# Patient Record
Sex: Female | Born: 1960 | Race: White | Hispanic: No | Marital: Married | Smoking: Never smoker
Health system: Southern US, Community
[De-identification: ages and names within clinical notes are randomized; demographics above are authoritative.]

## PROBLEM LIST (undated history)

## (undated) DIAGNOSIS — F32A Depression, unspecified: Secondary | ICD-10-CM

## (undated) DIAGNOSIS — G4700 Insomnia, unspecified: Secondary | ICD-10-CM

## (undated) DIAGNOSIS — F329 Major depressive disorder, single episode, unspecified: Secondary | ICD-10-CM

## (undated) DIAGNOSIS — G43909 Migraine, unspecified, not intractable, without status migrainosus: Secondary | ICD-10-CM

## (undated) DIAGNOSIS — N3281 Overactive bladder: Secondary | ICD-10-CM

## (undated) DIAGNOSIS — M797 Fibromyalgia: Secondary | ICD-10-CM

## (undated) DIAGNOSIS — I493 Ventricular premature depolarization: Secondary | ICD-10-CM

## (undated) DIAGNOSIS — K219 Gastro-esophageal reflux disease without esophagitis: Secondary | ICD-10-CM

## (undated) HISTORY — DX: Overactive bladder: N32.81

## (undated) HISTORY — DX: Fibromyalgia: M79.7

## (undated) HISTORY — DX: Migraine, unspecified, not intractable, without status migrainosus: G43.909

## (undated) HISTORY — DX: Major depressive disorder, single episode, unspecified: F32.9

## (undated) HISTORY — DX: Depression, unspecified: F32.A

## (undated) HISTORY — PX: MOLE REMOVAL: SHX2046

## (undated) HISTORY — DX: Insomnia, unspecified: G47.00

---

## 1996-03-14 HISTORY — PX: OTHER SURGICAL HISTORY: SHX169

## 1998-01-14 ENCOUNTER — Other Ambulatory Visit: Admission: RE | Admit: 1998-01-14 | Discharge: 1998-01-14 | Payer: Self-pay | Admitting: Gynecology

## 1998-02-12 ENCOUNTER — Other Ambulatory Visit: Admission: RE | Admit: 1998-02-12 | Discharge: 1998-02-12 | Payer: Self-pay | Admitting: Gynecology

## 1999-01-18 ENCOUNTER — Other Ambulatory Visit: Admission: RE | Admit: 1999-01-18 | Discharge: 1999-01-18 | Payer: Self-pay | Admitting: Gynecology

## 2000-03-21 ENCOUNTER — Other Ambulatory Visit: Admission: RE | Admit: 2000-03-21 | Discharge: 2000-03-21 | Payer: Self-pay | Admitting: Gynecology

## 2001-03-30 ENCOUNTER — Other Ambulatory Visit: Admission: RE | Admit: 2001-03-30 | Discharge: 2001-03-30 | Payer: Self-pay | Admitting: Gynecology

## 2002-01-01 ENCOUNTER — Encounter: Admission: RE | Admit: 2002-01-01 | Discharge: 2002-01-01 | Payer: Self-pay | Admitting: Gynecology

## 2002-01-01 ENCOUNTER — Encounter: Payer: Self-pay | Admitting: Gynecology

## 2002-04-15 ENCOUNTER — Other Ambulatory Visit: Admission: RE | Admit: 2002-04-15 | Discharge: 2002-04-15 | Payer: Self-pay | Admitting: Gynecology

## 2003-03-15 HISTORY — PX: OTHER SURGICAL HISTORY: SHX169

## 2003-04-17 ENCOUNTER — Other Ambulatory Visit: Admission: RE | Admit: 2003-04-17 | Discharge: 2003-04-17 | Payer: Self-pay | Admitting: Gynecology

## 2003-07-10 ENCOUNTER — Encounter: Admission: RE | Admit: 2003-07-10 | Discharge: 2003-07-10 | Payer: Self-pay | Admitting: Gynecology

## 2004-05-03 ENCOUNTER — Other Ambulatory Visit: Admission: RE | Admit: 2004-05-03 | Discharge: 2004-05-03 | Payer: Self-pay | Admitting: Gynecology

## 2004-07-20 ENCOUNTER — Encounter: Admission: RE | Admit: 2004-07-20 | Discharge: 2004-07-20 | Payer: Self-pay | Admitting: Gynecology

## 2005-05-13 ENCOUNTER — Other Ambulatory Visit: Admission: RE | Admit: 2005-05-13 | Discharge: 2005-05-13 | Payer: Self-pay | Admitting: Gynecology

## 2005-06-09 ENCOUNTER — Inpatient Hospital Stay (HOSPITAL_COMMUNITY): Admission: EM | Admit: 2005-06-09 | Discharge: 2005-06-11 | Payer: Self-pay | Admitting: Gynecology

## 2005-06-09 ENCOUNTER — Encounter (INDEPENDENT_AMBULATORY_CARE_PROVIDER_SITE_OTHER): Payer: Self-pay | Admitting: *Deleted

## 2005-06-09 ENCOUNTER — Encounter: Payer: Self-pay | Admitting: Gynecology

## 2005-06-28 ENCOUNTER — Encounter: Payer: Self-pay | Admitting: Vascular Surgery

## 2005-06-28 ENCOUNTER — Ambulatory Visit (HOSPITAL_COMMUNITY): Admission: RE | Admit: 2005-06-28 | Discharge: 2005-06-28 | Payer: Self-pay | Admitting: Gynecology

## 2005-07-21 ENCOUNTER — Encounter: Admission: RE | Admit: 2005-07-21 | Discharge: 2005-07-21 | Payer: Self-pay | Admitting: Gynecology

## 2005-10-11 ENCOUNTER — Other Ambulatory Visit: Admission: RE | Admit: 2005-10-11 | Discharge: 2005-10-11 | Payer: Self-pay | Admitting: Gynecology

## 2006-06-20 ENCOUNTER — Other Ambulatory Visit: Admission: RE | Admit: 2006-06-20 | Discharge: 2006-06-20 | Payer: Self-pay | Admitting: Gynecology

## 2006-07-24 ENCOUNTER — Encounter: Admission: RE | Admit: 2006-07-24 | Discharge: 2006-07-24 | Payer: Self-pay | Admitting: Gynecology

## 2007-06-25 ENCOUNTER — Other Ambulatory Visit: Admission: RE | Admit: 2007-06-25 | Discharge: 2007-06-25 | Payer: Self-pay | Admitting: Gynecology

## 2007-08-14 ENCOUNTER — Encounter: Admission: RE | Admit: 2007-08-14 | Discharge: 2007-08-14 | Payer: Self-pay | Admitting: Gynecology

## 2008-01-21 ENCOUNTER — Ambulatory Visit: Payer: Self-pay | Admitting: Gynecology

## 2008-07-14 ENCOUNTER — Encounter: Payer: Self-pay | Admitting: Gynecology

## 2008-07-14 ENCOUNTER — Other Ambulatory Visit: Admission: RE | Admit: 2008-07-14 | Discharge: 2008-07-14 | Payer: Self-pay | Admitting: Gynecology

## 2008-07-14 ENCOUNTER — Ambulatory Visit: Payer: Self-pay | Admitting: Gynecology

## 2008-09-09 ENCOUNTER — Encounter: Admission: RE | Admit: 2008-09-09 | Discharge: 2008-09-09 | Payer: Self-pay | Admitting: Gynecology

## 2009-07-20 ENCOUNTER — Other Ambulatory Visit: Admission: RE | Admit: 2009-07-20 | Discharge: 2009-07-20 | Payer: Self-pay | Admitting: Gynecology

## 2009-07-20 ENCOUNTER — Ambulatory Visit: Payer: Self-pay | Admitting: Gynecology

## 2009-08-21 ENCOUNTER — Ambulatory Visit: Payer: Self-pay | Admitting: Gynecology

## 2009-09-08 ENCOUNTER — Ambulatory Visit: Payer: Self-pay | Admitting: Gynecology

## 2009-11-24 ENCOUNTER — Encounter: Admission: RE | Admit: 2009-11-24 | Discharge: 2009-11-24 | Payer: Self-pay | Admitting: Gynecology

## 2009-11-24 ENCOUNTER — Ambulatory Visit: Payer: Self-pay | Admitting: Gynecology

## 2009-11-30 ENCOUNTER — Ambulatory Visit (HOSPITAL_BASED_OUTPATIENT_CLINIC_OR_DEPARTMENT_OTHER): Admission: RE | Admit: 2009-11-30 | Discharge: 2009-11-30 | Payer: Self-pay | Admitting: Gynecology

## 2009-11-30 ENCOUNTER — Ambulatory Visit: Payer: Self-pay | Admitting: Gynecology

## 2009-11-30 HISTORY — PX: OTHER SURGICAL HISTORY: SHX169

## 2009-12-07 ENCOUNTER — Ambulatory Visit: Payer: Self-pay | Admitting: Gynecology

## 2009-12-09 ENCOUNTER — Ambulatory Visit: Payer: Self-pay | Admitting: Gynecology

## 2010-01-04 ENCOUNTER — Ambulatory Visit: Payer: Self-pay | Admitting: Gynecology

## 2010-07-30 NOTE — Discharge Summary (Signed)
[name], [name]              ACCOUNT NO.:  [id number]   MEDICAL RECORD NO.:  [id number]          PATIENT TYPE:  INP   LOCATION:  1621                         FACILITY:  [facility]   PHYSICIAN:  Juan H. Lily Peer, M.D.DATE OF BIRTH:  07-28-1960   DATE OF ADMISSION:  06/09/2005  DATE OF DISCHARGE:  06/11/2005                                 DISCHARGE SUMMARY   Total days hospitalized:  2.   HISTORY:  The patient is a 50 year old with chronic pelvic pain and left  ovarian mass, had undergone an attempted laparoscopy for her chronic pelvic  pain and left ovarian mass.  Subsequently, she experienced a left epigastric  artery trocar injury requiring exploratory laparotomy for ligation of the  left epigastric artery and subsequently a left ovarian cystectomy.  An  intraoperative consultation with Dr. Darnell Level had been utilized and the  small and large bowel had been inspected thoroughly and no other injuries  were identified and it was due to bleeding epigastric vessel that had been  contained and secured at the time of the emergency laparotomy.  The patient  postoperatively did well.  After 24 hours she had adequate urine output, her  vital signs were stable.  Her hemoglobin was 10.8.  Her Foley catheter was  discontinued as well as her PCA was discontinued.  Her diet was advanced  from clear liquid to regular diet and she was ready to be discharged home on  her second day and was instructed to follow up in the office within 72 hours  to have her staples removed.  Final pathology report demonstrated immature  teratoma.   FINAL DISPOSITION AND FOLLOWUP:  The patient was discharged home on her  second postoperative day.  She was instructed to follow up in the office  within 72 hours to have her staples removed.  She was given a prescription  of Tylox to take one p.o. q.4-6h. p.r.n. pain.  Discharge instructions were  provided and she will follow up accordingly.      Juan H.  Lily Peer, M.D.  Electronically Signed     JHF/MEDQ  D:  07/27/2005  T:  07/27/2005  Job:  [id number]

## 2010-07-30 NOTE — Op Note (Signed)
Terri, Romero              ACCOUNT NO.:  [id number]   MEDICAL RECORD NO.:  [id number]          PATIENT TYPE:  INP   LOCATION:  1621                         FACILITY:  [facility]   PHYSICIAN:  Juan H. Lily Peer, M.D.DATE OF BIRTH:  May 04, 1960   DATE OF PROCEDURE:  06/09/2005  DATE OF DISCHARGE:                                 OPERATIVE REPORT   SURGEON:  Juan H. Lily Peer, MD   ASSISTANT:  Edyth Gunnels, MD   INDICATIONS FOR OPERATION:  A 50 year old with chronic pelvic pain and left  ovarian mass.   PREOPERATIVE DIAGNOSES:  1.  Chronic pelvic pain.  2.  Left ovarian mass.   POSTOPERATIVE DIAGNOSES:  1.  Chronic pelvic pain.  2.  Left ovarian mass.  3.  Left ovarian cyst.   ANESTHESIA:  General endotracheal anesthesia.   PROCEDURES PERFORMED:  1.  Attempted laparoscopy.  2.  Left epigastric artery trocar injury.  3.  Exploratory laparotomy.  4.  Ligation of left epigastric artery.  5.  Left ovarian cystectomy.  6.  Intraoperative consultation with general surgeon, Dr. Darnell Level.   FINDINGS:  1.  Left epigastric arterial injury secondary to trocar.  2.  Left dermoid cyst.  Normal-appearing tubes and normal contralateral      ovary.  No evidence of endometriosis.  No pelvic adhesions, but she did      have a significant amount of omental tissue and scarring from previous      laparoscopic cholecystectomy.   DESCRIPTION OF OPERATION:  After the patient was adequately counseled, she  was taken to the operating room where she underwent a successful general  endotracheal anesthesia.  The patient had PSA stockings for DVT prophylaxis  as she received 1 g of cefoxitin for prophylaxis as well.  The patient's  preoperative hemoglobin was 14.5.  Once general endotracheal anesthesia was  obtained, her abdomen was prepped and draped in the usual sterile fashion.  A Foley catheter had been inserted in an effort to monitor urinary output.  A Cohen's cannula had been placed  in an effort to a chromopertubation.  The  patient had wanted to maintain fertility and wanted to have both tubes and  ovaries left in place if at all possible and in case she wanted to get  pregnant in the future, although she is currently on oral contraceptive  pill.  Once the abdomen had been prepped and draped in a sterile fashion, a  small stab incision was made underneath the umbilicus using the clear  view/OptiVu trocar.  The peritoneal cavity appeared to have been entered,  although on numerous attempts of insufflation was unsuccessful.  A direct  visualization open laparoscopic technique, whereby the fascia had been  penetrated and the peritoneum.  The fascia was grasped with Allis clamps in  a pursestring fashion.  Then 0 Vicryl suture was utilized and under direct  visualization, the 10 mm trocar was placed, was removed.  The sleeve was  left in place, and the suture around the fascia was secured.  The  pneumoperitoneum was established with 3 liters of carbon dioxide.  The  patient was placed in Trendelenburg position and under laparoscopic  guidance, the first 5 mm port was made with a 5 mm trocar under laparoscopic  guidance in the right lower abdomen without any complications.  The second  trocar was made approximately 5 fingerbreadths in the midline in the lower  pelvis after transilluminating the abdomen and steering clear away for any  vessels and under laparoscopic guidance, a 5 mm trocar was inserted.  At  this point, it was noted that there was an arterial pumper with high  suspicion that a left epigastric artery and had been injured with the 5 mm  trocar, and immediately the laparoscopic instruments were removed and  emergency exploratory laparotomy was started in the following fashion.  A  Pfannenstiel skin incision was made 2 cm above the symphysis pubis.  The  incision was carried down from subcutaneous tissue down to the rectus  fascia.  The subcutaneous tissue was  freed, and identification of left  epigastric artery was evident where the bleeding was coming from and after  muscle-splitting technique of the rectus muscle to identify the epigastric  vessel.  They were individually secured with 3-0 Vicryl suture.  The  peritoneal cavity was entered.  Blood and clots were removed.  The pelvic  cavity was copiously irrigated with normal solution, and the general surgery  consultation with Dr. Darnell Level.  In a systematic fashion, the entire  pelvic cavity was inspected.  It appears that she has a small hematoma near  the trocar site, and the small bowel and mesentery were inspected.  No  evidence of injury, nor was there any injury to the sigmoid colon, the cul-  de-sac, or any of the major vessels in the pelvis under inspection.  Attention was then placed to the uterus and tubes and ovaries.  Both tubes  appeared to be normal with lush fimbriated end.  The right ovary was normal  on inspection.  The left ovary had a 3 x 3 cm cyst which was suspicious for  a dermoid.  Anterior and posterior cul-de-sac free of adhesions and  endometriosis.  An elliptical incision was made on the cortex of the left  ovary, and the capsule was peeled off allowing the cyst to be removed in  total, intact, and was passed off the operative field for histological  evaluation.  The left ovarian cortex was closed in a mattress-like suture  fashion with 3-0 Vicryl suture in a layered fashion for hemostasis and  reapproximation and reconstruction of the left ovary.  The pelvic cavity was  once again copiously irrigated with normal saline solution.  Thorough  inspection did not demonstrate any further bleeding, and closure was  started.  The visceral peritoneum was not reapproximated.  The rectus fascia  was closed with a running stitch of 0 Vicryl suture.  The subcutaneous bleeders were Bovie cauterized.  The skin was reapproximated with skin clips  followed by placement of  Xeroform gauze and 4 x 4 dressing.  The  subumbilical fascia was closed with a previous pursestring suture of 0  Vicryl suture, and the skin was reapproximated with a 4-0 plain catgut  suture in a subcuticular fashion.  Both 5 mm ports were closed with a single  stapler and for postoperative analgesia, approximately 20 mL of 0.25%  Marcaine was infiltrated in all 3 port sites.  Of note, a Cohen's cannula  had been removed during the exploratory laparotomy.  Inspection at the  completion of the operation  inspected the area where the tenaculum was  holding the anterior cervical lip.  There was some mild oozing which was  contained with silver nitrate.  The patient was then awakened and  transferred to recovery room with stable vital signs.  Her preoperative  hemoglobin was 14.5.  Intraoperative hemoglobin was 10.6.  She received 3200  mL of lactated Ringer's.  Her urine output was 200 mL and clear.      Juan H. Lily Peer, M.D.  Electronically Signed     JHF/MEDQ  D:  06/09/2005  T:  06/11/2005  Job:  [id number]

## 2010-07-30 NOTE — H&P (Signed)
Romero, [name]              ACCOUNT NO.:  [id number]   MEDICAL RECORD NO.:  [id number]           PATIENT TYPE:   LOCATION:                                 FACILITY:   PHYSICIAN:  Juan H. Lily Peer, M.D.     DATE OF BIRTH:   DATE OF ADMISSION:  DATE OF DISCHARGE:                                HISTORY & PHYSICAL   CHIEF COMPLAINT:  1.  Chronic pelvic pain.  2.  Left ovarian mass.   HISTORY OF PRESENT ILLNESS:  The patient is a 50 year old, gravida 0, who  was seen in the office for a preoperative consultation on June 02, 2005.  The patient stated that she had been complaining of low back pain and  abdominal discomfort for 11 months.  Sometimes she stated her pains radiate  to the right lower abdomen.  She also suffers from dyspareunia.  She states  she cannot relate the pain to a particular time in her cycle.  She said it  occurs all the time.  She had been evaluated previously by her primary  physician, Dr. Gwendlyn Deutscher, and he ordered an x-ray and MRI which all were  negative.  She subsequently was referred to Dr. Chaney Malling, an orthopedist,  who did a bone scan and no abnormalities were noted.  There was no evidence  of herniated disk, but an ultrasound of the pelvis had not been done.  The  patient continued to have lower abdominal pain.  She had been tender on  examination but no rebound/guarding.  On pelvic exam, she was tender mostly  on the right adnexa and a discernible mass per se could not be palpated.  Interestingly, an ultrasound was done which demonstrated normal right ovary  but her left ovary had adnexal echogenic mass measuring 3.9 x 3.6 x 3.8 cm,  possibly a dermoid cyst, but with her symptoms, this possibly could be an  endometrioma.  The patient is wishing to maintain her fertility, although  she is 50 years of age, but has agreed to proceed with a laparoscopic left  salpingo-oophorectomy.   PAST MEDICAL HISTORY:  1.  She denies any allergies.  2.  She  has history of cervical dysplasia.  3.  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  4.  Fibromyalgia.  5.  Depression.  6.  Overactive bladder, although she is no longer on Detrol.  7.  Migraine headaches.   PAST SURGICAL HISTORY:  1.  Pilonidal cyst in 1998.  2.  LEEP cervical conization.  3.  She had a precancerous mole removed on her back in 2004.  4.  Laparoscopic cholecystectomy in 2005.   MEDICATIONS:  1.  The patient is on at least 28-day oral contraceptive pill.  2.  Calcium supplementation.  3.  Inderal 120 mg every other day for migraine headaches.  4.  Imitrex p.r.n.   FAMILY HISTORY:  Cardiovascular disease in her father.  Paternal grandmother  with breast cancer.   PHYSICAL EXAMINATION:  VITAL SIGNS:  Weight 183 pounds, height 5  feet 6  inches tall, blood pressure 122/84.  HEENT:  Unremarkable.  NECK:  Supple.  Trachea midline.  No carotid bruits.  No thyromegaly.  LUNGS:  Clear to auscultation without any rhonchi, rubs, or wheezes.  HEART:  Regular rate and rhythm without murmurs or gallops.  BREASTS:  Done at the time of her annual exam in March of this year which  was normal.  ABDOMEN:  Soft, minimal tenderness in the right lower quadrant but no  rebound or guarding.  PELVIC:  Bartholin's, urethral, and Skene's within normal limits.  Vagina  and cervix with no lesion or discharge.  Uterus anteverted, normal size,  shape, and consistency.  Adnexa without any masses or tenderness.  RECTAL:  Exam unremarkable.   ASSESSMENT:  A 50 year old, gravida 0, with chronic lower abdominal and back  pain.  Ultrasound demonstrating that she has a solid echogenic mass  measuring 3.9 x 3.6 x 3.8 cm off the left ovary, possible dermoid cyst.  Right ovary was normal.  Normal size uterus.  The patient is scheduled to  undergo laparoscopic left salpingo-oophorectomy and possible treatment of  underlying endometriosis.  If this is not a  dermoid, the most likely  diagnosis would be endometriosis with her symptoms also of dyspareunia.  The  risks and benefits and pros and cons of the operation were discussed with  the patient to include infection, bleeding, trauma to internal organs, and  in the event of technical difficulty, the laparoscope will need to be  removed, the incision extended, and allow direct entrance into the  peritoneal cavity to finish the operation, also in the event of any trauma  to internal organs such as to the bladder, intestines, or other internal  abdominal structures; the risk of deep venous thrombosis, although she will  have PSA stockings for prophylaxis.  For prophylaxis of infection, she will  have intravenous antibiotic.  All efforts will be made to preserve as much  fertility as possible.  Will also do a chromopertubation to make sure that  her tube is patent on the remaining side.  Also potential risks are  hemorrhage.  In the event that she would need blood or blood products, she  is fully aware of the potential risk of anaphylactic reaction, hepatitis,  and AIDS.  All of these issues were discussed with the patient and all  questions were answered and we will follow accordingly.   PLAN:  The patient is scheduled for laparoscopic left salpingo-oophorectomy  Thursday, March 29, at 1 p.m. in [facility].      Juan H. Lily Peer, M.D.  Electronically Signed     JHF/MEDQ  D:  06/08/2005  T:  06/09/2005  Job:  [id number]

## 2010-09-13 ENCOUNTER — Encounter (INDEPENDENT_AMBULATORY_CARE_PROVIDER_SITE_OTHER): Payer: BC Managed Care – PPO | Admitting: Gynecology

## 2010-09-13 ENCOUNTER — Other Ambulatory Visit (HOSPITAL_COMMUNITY)
Admission: RE | Admit: 2010-09-13 | Discharge: 2010-09-13 | Disposition: A | Discharge status: Home / Self Care | Payer: BC Managed Care – PPO | Source: Ambulatory Visit | Attending: Gynecology | Admitting: Gynecology

## 2010-09-13 ENCOUNTER — Other Ambulatory Visit: Payer: Self-pay | Admitting: Gynecology

## 2010-09-13 DIAGNOSIS — R635 Abnormal weight gain: Secondary | ICD-10-CM

## 2010-09-13 DIAGNOSIS — Z124 Encounter for screening for malignant neoplasm of cervix: Secondary | ICD-10-CM | POA: Insufficient documentation

## 2010-09-13 DIAGNOSIS — R809 Proteinuria, unspecified: Secondary | ICD-10-CM

## 2010-09-13 DIAGNOSIS — Z01419 Encounter for gynecological examination (general) (routine) without abnormal findings: Secondary | ICD-10-CM

## 2010-09-13 DIAGNOSIS — Z13220 Encounter for screening for lipoid disorders: Secondary | ICD-10-CM

## 2010-09-14 ENCOUNTER — Other Ambulatory Visit: Payer: BC Managed Care – PPO

## 2010-09-14 ENCOUNTER — Ambulatory Visit (INDEPENDENT_AMBULATORY_CARE_PROVIDER_SITE_OTHER): Payer: BC Managed Care – PPO | Admitting: Gynecology

## 2010-09-14 DIAGNOSIS — N831 Corpus luteum cyst of ovary, unspecified side: Secondary | ICD-10-CM

## 2010-09-14 DIAGNOSIS — N946 Dysmenorrhea, unspecified: Secondary | ICD-10-CM

## 2010-09-14 DIAGNOSIS — N949 Unspecified condition associated with female genital organs and menstrual cycle: Secondary | ICD-10-CM

## 2010-09-16 ENCOUNTER — Encounter: Payer: Self-pay | Admitting: *Deleted

## 2010-09-21 ENCOUNTER — Other Ambulatory Visit (INDEPENDENT_AMBULATORY_CARE_PROVIDER_SITE_OTHER): Payer: BC Managed Care – PPO

## 2010-09-21 DIAGNOSIS — R799 Abnormal finding of blood chemistry, unspecified: Secondary | ICD-10-CM

## 2010-09-28 ENCOUNTER — Other Ambulatory Visit (INDEPENDENT_AMBULATORY_CARE_PROVIDER_SITE_OTHER): Payer: BC Managed Care – PPO

## 2010-09-28 DIAGNOSIS — D72829 Elevated white blood cell count, unspecified: Secondary | ICD-10-CM

## 2010-10-01 ENCOUNTER — Other Ambulatory Visit: Payer: BC Managed Care – PPO

## 2010-10-01 ENCOUNTER — Institutional Professional Consult (permissible substitution) (INDEPENDENT_AMBULATORY_CARE_PROVIDER_SITE_OTHER): Payer: BC Managed Care – PPO | Admitting: Gynecology

## 2010-10-01 DIAGNOSIS — N83209 Unspecified ovarian cyst, unspecified side: Secondary | ICD-10-CM

## 2010-10-18 ENCOUNTER — Other Ambulatory Visit: Payer: BC Managed Care – PPO

## 2010-11-22 ENCOUNTER — Telehealth: Payer: Self-pay | Admitting: *Deleted

## 2010-11-22 ENCOUNTER — Other Ambulatory Visit: Payer: Self-pay | Admitting: Gynecology

## 2010-11-22 DIAGNOSIS — Z1231 Encounter for screening mammogram for malignant neoplasm of breast: Secondary | ICD-10-CM

## 2010-11-22 NOTE — Telephone Encounter (Signed)
Pt called stating that on 10/01/10 office visit. Pt was told by dr. Glenetta Romero to make appointment of f/u u/s of cyst in September. Pt dictation note on 7/20/212 the pt should come in 3 months from this date. Recall letter in computer per donna to remind pt to make ultrasound appointment.

## 2010-11-26 ENCOUNTER — Ambulatory Visit
Admission: RE | Admit: 2010-11-26 | Discharge: 2010-11-26 | Disposition: A | Discharge status: Home / Self Care | Payer: BC Managed Care – PPO | Source: Ambulatory Visit | Attending: Gynecology | Admitting: Gynecology

## 2010-11-26 DIAGNOSIS — Z1231 Encounter for screening mammogram for malignant neoplasm of breast: Secondary | ICD-10-CM

## 2011-02-28 ENCOUNTER — Telehealth: Payer: Self-pay | Admitting: *Deleted

## 2011-02-28 MED ORDER — LEVONORGESTREL-ETHINYL ESTRAD 0.1-20 MG-MCG PO TABS: 1.0000 | ORAL_TABLET | Freq: Every day | ORAL | Status: DC

## 2011-02-28 NOTE — Telephone Encounter (Signed)
Pt called wanting refill on birth control alessa 28 day tablet. rx sent to phamacy. Last annual in July 2012

## 2011-06-14 ENCOUNTER — Other Ambulatory Visit: Payer: Self-pay | Admitting: Gynecology

## 2011-10-05 ENCOUNTER — Encounter: Payer: Self-pay | Admitting: Gynecology

## 2011-10-05 ENCOUNTER — Ambulatory Visit (INDEPENDENT_AMBULATORY_CARE_PROVIDER_SITE_OTHER): Payer: BC Managed Care – PPO | Admitting: Gynecology

## 2011-10-05 VITALS — BP 128/86 | Ht 65.0 in | Wt 183.0 lb

## 2011-10-05 DIAGNOSIS — R635 Abnormal weight gain: Secondary | ICD-10-CM

## 2011-10-05 DIAGNOSIS — N3946 Mixed incontinence: Secondary | ICD-10-CM | POA: Insufficient documentation

## 2011-10-05 DIAGNOSIS — Z01419 Encounter for gynecological examination (general) (routine) without abnormal findings: Secondary | ICD-10-CM

## 2011-10-05 DIAGNOSIS — R102 Pelvic and perineal pain unspecified side: Secondary | ICD-10-CM

## 2011-10-05 DIAGNOSIS — Z8742 Personal history of other diseases of the female genital tract: Secondary | ICD-10-CM

## 2011-10-05 DIAGNOSIS — N949 Unspecified condition associated with female genital organs and menstrual cycle: Secondary | ICD-10-CM

## 2011-10-05 DIAGNOSIS — R6882 Decreased libido: Secondary | ICD-10-CM

## 2011-10-05 DIAGNOSIS — N393 Stress incontinence (female) (male): Secondary | ICD-10-CM

## 2011-10-05 LAB — CBC WITH DIFFERENTIAL/PLATELET
Basophils Absolute: 0 10*3/uL (ref 0.0–0.1)
Eosinophils Absolute: 0.1 10*3/uL (ref 0.0–0.7)
Eosinophils Relative: 1 % (ref 0–5)
Lymphs Abs: 1.5 10*3/uL (ref 0.7–4.0)
MCH: 31.2 pg (ref 26.0–34.0)
MCHC: 34.4 g/dL (ref 30.0–36.0)
MCV: 90.5 fL (ref 78.0–100.0)
Platelets: 344 10*3/uL (ref 150–400)
RDW: 12.8 % (ref 11.5–15.5)

## 2011-10-05 MED ORDER — LEVONORGESTREL-ETHINYL ESTRAD 0.1-20 MG-MCG PO TABS: 1.0000 | ORAL_TABLET | Freq: Every day | ORAL | Status: DC

## 2011-10-05 NOTE — Patient Instructions (Addendum)
Exercise to Lose Weight Exercise and a healthy diet may help you lose weight. Your doctor may suggest specific exercises. EXERCISE IDEAS AND TIPS  Choose low-cost things you enjoy doing, such as walking, bicycling, or exercising to workout videos.   Take stairs instead of the elevator.   Walk during your lunch break.   Park your car further away from work or school.   Go to a gym or an exercise class.   Start with 5 to 10 minutes of exercise each day. Build up to 30 minutes of exercise 4 to 6 days a week.   Wear shoes with good support and comfortable clothes.   Stretch before and after working out.   Work out until you breathe harder and your heart beats faster.   Drink extra water when you exercise.   Do not do so much that you hurt yourself, feel dizzy, or get very short of breath.  Exercises that burn about 150 calories:  Running 1  miles in 15 minutes.   Playing volleyball for 45 to 60 minutes.   Washing and waxing a car for 45 to 60 minutes.   Playing touch football for 45 minutes.   Walking 1  miles in 35 minutes.   Pushing a stroller 1  miles in 30 minutes.   Playing basketball for 30 minutes.   Raking leaves for 30 minutes.   Bicycling 5 miles in 30 minutes.   Walking 2 miles in 30 minutes.   Dancing for 30 minutes.   Shoveling snow for 15 minutes.   Swimming laps for 20 minutes.   Walking up stairs for 15 minutes.   Bicycling 4 miles in 15 minutes.   Gardening for 30 to 45 minutes.   Jumping rope for 15 minutes.   Washing windows or floors for 45 to 60 minutes.  Document Released: 04/02/2010 Document Revised: 11/10/2010 Document Reviewed: 04/02/2010 [facility] Patient Information 2012 [place], [place].                                           Dietary therapy for weight gain   INTRODUCTION - The optimal management of overweight and obesity requires a combination of diet, exercise, and behavioral modification. In addition, some patients  eventually require pharmacologic therapy or bariatric surgery. The risk of overweight to the subject should be evaluated before beginning any treatment program. Selection of treatment can then be made using a risk-benefit assessment). The choice of therapy is dependent on several factors including the degree of overweight or obesity and patient preference.  This topic will review the dietary therapy of obesity. Other aspects of treatment are discussed separately. (See "Health hazards associated with obesity in adults" and "Overview of therapy for obesity in adults" and "Drug therapy of obesity" and "Behavioral strategies in the treatment of obesity".) GOALS OF WEIGHT LOSS - It is important to set goals when discussing a dietary weight loss program with an individual patient. An initial weight loss goal of 5 to 7 percent of body weight is realistic for most individuals. The first goal for any overweight individual is to prevent further weight gain and keep body weight stable (within 5 pounds of its current level).  The goal of the clinician is to identify and review with the patient a realistic weight-loss goal. Most patients have a weight loss goal of 30 percent or more below current weight, which  is unrealistic [1].  A successful program will lead to a weight loss of more than 5 percent of initial weight [2]. A weight loss of more than 5 percent can reduce risk factors for cardiovascular disease, such as dyslipidemia, hypertension, and diabetes mellitus [3]. In the Diabetes Prevention Program, a multi-center trial in patients with impaired glucose tolerance, weight loss of 7 percent reduced the rate of progression from impaired glucose tolerance to diabetes by 58 percent [4]. (See "Prediction and prevention of type 2 diabetes mellitus", section on 'Diabetes Prevention Program'.)  Loss of 5 percent of initial body weight and maintenance of this loss is a good medical result, even if the subject does not reach  his or her "dream" weight.  Although an extremely difficult goal to achieve, a body mass index (BMI) between 20 and 25 kg/m2 puts the subject in the lowest risk category (table 1 and figure 1). DIETARY ENERGY Rate of weight loss - The rate of weight loss is directly related to the difference between the subject's energy intake and energy requirements. Reducing caloric intake below expenditure results in a predictable initial rate of weight loss that is related to the energy deficit [5,6]. However, prediction of weight loss for an individual subject can be difficult because of marked intersubject variability in initial body composition, adherence, and energy expenditure [5,7]. Food records are often inaccurate. Most normal-weight people under-report what they eat by 10 to 30 percent, while overweight people under-report by 30 percent or more [8]. In addition, energy requirements are influenced by fidgeting, gender, age, and genetic factors [5,6,9]. As examples: Men lose more weight than women of similar height and weight when they comply with eating any given diet because men have more lean body mass, less percent body fat, and therefore higher energy expenditure.  Older subjects of either sex have a lower energy expenditure and therefore lose weight more slowly than younger subjects; metabolic rate declines by approximately 2 percent per decade (about 100 kcal/decade) [10].  The importance of genetic factors is illustrated by a study of identical female twin pairs who were overfed to induce weight gain [11]. Twelve twin pairs were overfed by 1000 kcal/day for 84 of 100 days. The degree of weight gain at a constant dietary caloric increment varied widely among the twin pairs (from 4.3 to 13.3 kg), in fact, there was three times the variance for both weight and fat mass among the twin pairs when compared with that within the twin pairs. Approximately 22 kcal/kg is required to maintain a kilogram of body weight in  a normal adult. Thus, the expected or calculated energy expenditure for a woman weighing 100 kg is approximately 2200 kcal/day. The variability of 20 percent could give energy needs as high as 2620 kcal/day or as low as 1860 kcal/day. An average deficit of 500 kcal/day should result in an initial weight loss of approximately 0.5 kg/week (1 lb/week). However, after three to six months of weight loss, energy expenditure adaptations occur, which slow the bodyweight response to a given change in energy intake, thereby diminishing ongoing weight loss [7]. There are several methods of formally estimating energy expenditure; we suggest using the WHO criteria (table 2). This method allows a direct estimate of resting metabolic rate (RMR) and calculation of daily energy requirement. The low activity level (1.3 x RMR) includes subjects who lead a sedentary life. The high activity level (1.7 x RMR) applies to those in jobs requiring manual labor or patients with regular daily physical exercise   programs [12]. Maintenance of weight loss - It is important for the overweight subject to understand that achieving and maintaining weight loss is made difficult by the reduction in energy expenditure that is induced by weight loss (figure 2) [13]. Weight loss maintenance is also difficult because of changes in the peripheral hormone signals that regulate appetite. Gastrointestinal peptides, such as ghrelin, which stimulates appetite, and gastric inhibitory polypeptide, which may promote energy storage, increase after diet-induced weight loss. Other circulating mediators that inhibit intake (eg, leptin, peptide YY, cholecystokinin, pancreatic polypeptide) decrease. These hormonal adaptations favoring weight gain persist for at least one year after diet-induced weight loss [14]. (See "Overview of therapy for obesity in adults", section on 'Maintenance of weight loss' and "Pathogenesis of obesity", section on 'Ghrelin'.)  TYPES OF  DIETS - The general consensus is that excess intake of calories from any source, associated with a sedentary lifestyle, causes weight gain and obesity. The goal of dietary therapy, therefore, is to decrease energy intake from food. Conventional diets are defined as those below energy requirements but above 800 kcal/day [15]. These diets fall into four groups: Balanced low-calorie diets/portion-controlled diets  Low-fat diets  Low-carbohydrate diets  Mediterranean diet  Fad diets (diets involving unusual combinations of foods or eating sequences) Commercial weight loss programs and internet-based programs are discussed elsewhere. (See "Behavioral strategies in the treatment of obesity".) Balanced low-calorie diets - Planning a diet requires the selection of a caloric intake and then selection of foods to meet this intake. It is desirable to eat foods with adequate nutrients in addition to protein, carbohydrate, and essential fatty acids. Thus, weight-reducing diets should eliminate alcohol, sugar-containing beverages, and most highly concentrated sweets because they rarely contain adequate amounts of other nutrients besides energy. Breakdown of some protein is to be expected during weight loss. When weight increases as a result of overeating, approximately 75 percent of the extra energy is stored as fat and the remaining 25 percent as lean tissue. If the lean tissue contains 20 percent protein, then 5 percent of the extra weight gain would be protein. Thus, it should be anticipated that during weight loss, at least 5 percent of weight loss will be protein. A desirable feature of any calorie restricted diet, however, is that it results in the lowest possible loss of protein, recognizing that this will not be less than 5 percent of the weight that is lost. Portion-controlled diets - One simple approach to providing a calorie-controlled diet is to use individually packaged foods, such as formula diet drinks  using powdered or liquid formula diets, nutrition bars, frozen food, and pre-packaged meals that can be stored at room temperature as the main source of nutrients. Frozen low-calorie meals containing 250 to 350 kcal/package can be a convenient and nutritious way to do this. We have often recommended the use of formula diets or breakfast bars for breakfast, formula diets or a frozen lunch entree for lunch, and a frozen calorie-controlled entree with additional vegetables for dinner. In this way, it is possible to obtain a calorie-controlled 1000 to 1500 kcal per day diet. In one four-year study this approach resulted in early initial weight loss, which then was maintained [16]. I do not recommend the use of formula diets alone because they do not provide adequate nutritional variety. Low-fat diets - Low-fat diets are another standard strategy to help patients lose weight, and almost all dietary guidelines recommend a reduction in the daily intake of fat to 30 percent of energy intake or less [  17,18]. In a meta-analysis of trials comparing low-fat diets (typically 20 to 25 percent of energy from fats) with a control group consuming a usual diet or a medium fat diet (usually 35 to 40 percent of energy), there was greater weight loss (approximately 3 kg) with low-fat compared with moderate fat diets [19]. In addition, one report noted that people who successfully keep their weight reduced adopt three strategies, one of which is eating a lower fat diet [20]. (See "Dietary fat" and "Etiology and natural history of obesity", section on 'Dietary habits'.) A low-fat dietary pattern with healthy carbohydrates is not associated with weight gain. This was illustrated by the [facility] Dietary Modification Trial of [phone] postmenopausal women over age 27 years who were randomly assigned to a dietary intervention that included group and individual sessions to promote a decrease in fat intake and increases in  fruit, vegetable, and grain consumption (healthy carbohydrates), but did not include weight loss or caloric restriction goals, or a control group which received only dietary educational materials [21]. After an average of 7.5 years of follow-up, the following results were seen: Women in the intervention group lost weight in the first year (mean of 2.2 kg) and maintained lower weight than the control women at 7.5 years (difference of 1.9 kg at one year, and 0.4 kg at 7.5 years).  No tendency toward weight gain was seen in the intervention group overall, or when stratified by age, ethnicity, or body mass index.  Weight loss was related to the level of fat intake and was greatest in women who decreased their percentage of energy from fat the most. A similar, but lesser trend was seen with increased vegetable and fruit intake. A low-fat diet can be implemented in two ways. First, the dietitian can provide the subject with specific menu plans that emphasize the use of reduced fat foods. As one guideline, if a food "melts" in your mouth, it probably has fat in it. Second, subjects can be instructed in counting fat grams as an alternative to counting calories. Fat has 9.4 kcal/g. It is thus very easy to calculate the number of grams of fat a subject can eat for any given level of energy intake. Many experts recommend keeping calories from fat to below 30 percent of total calories. In practical terms, this means eating about 33 g of fat for each 1000 calories in the diet. For simplicity, I use 30 g of fat or less for each 1000 kcal. For a 1500-calorie diet, this would mean about 45 g or less of fat, which can be counted using the nutrition information labels on food packages. Low-carbohydrate diets - Proponents of low-carbohydrate diets have argued that the increasing obesity epidemic may be in part due to low-fat, high-carbohydrate diets. But this may be dependent upon the type of carbohydrates that are eaten, such  as energy dense snacks and sugar or high fructose containing beverages. The carbohydrate content of the diet is an important determinant of short-term (less than two weeks) weight loss. Low (60 to 130 grams of carbohydrates) and very low-carbohydrate diets (0 to <60 grams) have been popular for many years [15]. Restriction of carbohydrates leads to glycogen mobilization and, if carbohydrate intake is less than 50 g/day, ketosis will develop. Rapid weight loss occurs, primarily due to glycogen breakdown and fluid loss rather than fat loss. Low and very low-carbohydrate diets are more effective for short-term weight loss than low-fat diets, although probably not for long-term weight loss. A meta-analysis  of five trials found that the difference in weight loss at six months, favoring the low carbohydrate over low fat diet, was not sustained at 12 months [22]. (See 'Comparison trials' below.) Low-carbohydrate diets may have some other beneficial effects with regard to risk of developing type 2 diabetes mellitus, coronary heart disease, and some cancers, particularly if attention is paid to the type as well as the quantity of carbohydrate. A low-carbohydrate diet can be implemented in two ways, either by reducing the total amount of carbohydrate or by consuming foods with a lower glycemic index or glycemic load (table 3). Glycemic index and load are reviewed separately. (See "Dietary carbohydrates", section on 'Glycemic index'.) If a low-carbohydrate diet is chosen, healthy choices for fat (mono- and polyunsaturated fats) and protein (fish, nuts, legumes, and poultry) should be encouraged because of the association between saturated fat intake and risk of coronary heart disease. During 26 years of follow-up of women in the Nurses' Health Study and 20 years of follow-up of men in the Health Professionals' Follow-up Study, low carbohydrate diets in the highest versus lowest decile for vegetable proteins and fat were  associated with lower all-cause mortality (HR 0.80, 95% CI 0.75-0.85) and cardiovascular mortality (HR 0.77, 95% CI 0.68-0.87) [23]. In contrast, low carbohydrate diets in the highest versus lowest decile for animal protein and fat were associated with higher all-cause (HR 1.23, 95% CI 1.11-1.37) and cardiovascular (HR 1.14, 95% CI 1.01-1.29) mortality. (See "Dietary fat" and "Overview of primary prevention of coronary heart disease and stroke", section on 'Healthy diet'.) High protein diets - Some popular books recommend high protein diets [24]. In one trial, low-fat diets with 12 percent and 25 percent protein content were compared. Weight loss over six months was greater with the higher protein diet (9 versus 5 kg), but the difference was no longer significant at 12 and 24 months [25]. Higher protein diets may improve weight maintenance, as illustrated by the results of a study of 60 subjects randomly assigned to a low fat, high protein versus low-fat, high-carbohydrate diet after completing a four week very low calorie diet [26]. Among the subjects who completed the three-month study (n = 48), the high protein diet group had significantly better weight maintenance (between group difference of 2.3 kg). High dietary protein intake, due to its acid-producing load, increases urinary calcium excretion (with potential risk for bone loss and calcium stone formation) [27]. Urinary calcium excretion does appear to increase when dietary intake of protein increases [27-29], and this could pose a long-term risk for nephrolithiasis. (See "Risk factors for calcium stones in adults", section on 'Dietary risk factors'.) However, two small randomized trials that looked at bone metabolism found evidence that increased dietary protein may decrease bone resorption [28,29]. One of the trials found that increased intestinal absorption of calcium was primarily responsible for the increased urinary excretion of calcium and that the  excreted calcium was not coming from bone [29]. Mediterranean diet - The term Mediterranean diet refers to a dietary pattern that is common in [place] areas of the [place] area. Although there is some variation in Mediterranean diets, there are some common components that include a high level of monounsaturated fat relative to saturated; moderate consumption of alcohol, mainly as wine; a high consumption of vegetables, fruits, legumes, and grains; a moderate consumption of milk and dairy products, mostly in the form of cheese; and a relatively low intake of meat and meat products. A meta-analysis of 12 studies involving eight cohorts found that a   Mediterranean diet was associated with improved health status and reductions in overall mortality, cardiovascular mortality, cancer mortality, and incidence of Parkinson's disease and Alzheimer's disease [30]. (See "Healthy diet in adults", section on 'Mediterranean diet'.) Very low-calorie diets - Diets with energy levels between 200 and 800 kcal/day are called "very low-calorie diets," while those below 200 kcal/day can be termed starvation diets. The basis for these diets was the notion that the lower the calorie intake the more rapid the weight loss, because the energy withdrawn from body fat stores is a function of the energy deficit. Starvation is the ultimate very low-calorie diet and results in the most rapid weight loss. Although once popular, starvation diets are now rarely used for treatment of obesity. Very low-calorie diets have not been shown to be superior to conventional diets for long-term weight loss. In a meta-analysis of six trials comparing very low-calorie diets with conventional low-calorie diets, short-term weight loss was greater with very low-calorie diets (16.1 versus 9.7 versus percent of initial weight), but there was no difference in long-term weight loss (6.3 versus 5.0 percent) [31]. As with all diets, very low-calorie diets  initially result in substantial protein loss that diminishes with time. Other expected effects include reduction in blood pressure and improvement in hyperglycemia in diabetic patients. Subjects adhering to very low-calorie diets usually have a fall in blood pressure, especially during the first week. Antihypertensive drugs, especially calcium channel blockers and diuretics, should usually be discontinued when a very low calorie diet is begun unless moderate to severe hypertension is present.  Most diabetic patients eating very low-calorie diets have marked improvement in hyperglycemia. Blood glucose concentrations fall within the first one to two weeks, and remain lower as long as the diet is continued. Those patients taking less than 50 units of insulin or an oral hypoglycemic drug will usually be able to discontinue therapy [32]. The side effects of very low-calorie diets include hair loss, thinning of the skin, and coldness. These diets are contraindicated for lactating and pregnant women, and in children who require protein for linear growth. As with all diets, there is increased cholesterol mobilization from peripheral fat stores, thus increasing the risk of gallstones. Very low-calorie diets should be reserved for subjects who require rapid weight loss for a specific purpose, such as surgery. The weight regain when the diet is stopped is often rapid, and it is better to take a more sustainable approach than to use a method that cannot be sustained. Comparison trials - The impact of specific dietary composition on weight change remains uncertain. When energy from dietary carbohydrates decreases, energy from fat sources tends to increase. The reverse is also true; when energy from dietary fats decreases, energy from carbohydrate sources tends to increase. The debate has mainly centered on whether low-fat or low-carbohydrate diets can better induce weight loss and sustain it over the long-term. Weight loss  diets - Initial trials evaluating the effect of type of diet (predominantly low-carbohydrate versus low-fat) on weight loss and other outcomes showed that weight loss at six months was approximately 4 kg greater in the very low-carbohydrate group than in the low-fat group [33-35]. Trials lasting for one year, however, did not find a significant difference in weight loss [34,36,37]. A meta-analysis of five trials (including one study not referenced above) found that the difference in weight loss at six months, favoring the low carbohydrate over low fat diet, was not sustained at 12 months [22]. In one study, this convergence was mainly due to   regain of weight in the low-carbohydrate group [34]; in another, the convergence was due to ongoing weight loss in the low-fat group (figure 3) [36]. Some of these initial comparison trials of different dietary regimens had important limitations [22]. These included high dropout rates (21 to 48 percent), suboptimal dietary compliance, and limited long-term follow-up. Subsequent trials are larger, of longer duration (lasting one to two years), and have conflicting results with regard to the impact of macronutrient composition on weight loss [38-41]. In contrast, all trials found that dietary adherence is an important determinant of weight loss, independent of macronutrient composition. The following observations illustrate the range of findings in these trials: In one trial, 322 moderately obese subjects (86 percent men) were randomly assigned to a low-fat (restricted calorie), Mediterranean (moderate-fat, restricted calorie, rich in vegetables, low in red meat), or low-carbohydrate (non-restricted-calorie) diet for two years [38]. Adherence rates were higher than those reported in previous trials (95.4 and 84.6 percent at one and two years, respectively). Weight loss was greater with the Mediterranean and low-carbohydrate diets than the low-fat diet (mean weight loss 4.4,  4.7, and 2.9 kg, respectively).  The most favorable effect on lipids (increased HDL and decreased triglycerides and ratio of total cholesterol to HDL) was seen in the low-carbohydrate group. Among subjects with type 2 diabetes, the greatest improvement in glycemic control occurred with the Mediterranean diet. Among all groups, weight loss was greater for those who completed the two year study than for those who withdrew.  Another randomized trial compared four different diets in 311 overweight and obese premenopausal women: very low-carbohydrate (Atkins); macronutrient balance controlling glycemic load (Zone); general calorie restriction, low-fat (LEARN); and very low-fat (Ornish) [39]. In the intention-to-treat analysis at one year, mean weight loss was greater in the Atkins diet group compared with the other groups (4.7, 1.6, 2.2, and 2.6 kg, respectively). Pairwise comparisons showed a significant difference only for Atkins versus Zone.  The most favorable effect on triglycerides and HDL-C was seen in the Atkins group. Dietary adherence rates (77 to 88 percent) were similar among the groups and better than in previous trials. Within each group, adherence was significantly associated with weight loss [42].  In the largest trial to date, 811 overweight and obese adults were randomly assigned to one of four diets based upon macronutrient content: low or high fat (20 to 40 percent), which provided carbohydrate at 35, 45, 55, or 65 percent, and high or average protein (15 to 25 percent) [40]. After six months, mean weight loss in each group was 6 kg. By two years, mean weight loss was 3 to 4 kg, and weight losses remained similar in all groups. Many participants had trouble attaining target levels of macronutrients. Subjects who attended the greatest number of group sessions (most adherent) lost the most weight. Thus, any diet that is adhered to will produce modest weight loss, but adherence rates are low with  most diets. Although a low-carbohydrate diet may be associated with greater short-term weight loss, superior weight loss in the long-term has not been established. The optimal mix of macronutrients likely depends upon individual factors [43]. A principal determinant of weight loss appears to be the degree of adherence to the diet, irrespective of the particular macronutrient composition [37,39,40,42,44,45]. Thus, we suggest choosing a macronutrient mix based upon patient preferences, which may improve long-term adherence. Behavioral modification to improve dietary compliance with any type of diet may have the greatest impact on long-term weight loss. (See "Behavioral strategies in the treatment  of obesity".) Lipids - The observed effects on blood lipids were similar for trials comparing low fat and very low carbohydrate diets [22,33-36,46]; the low-carbohydrate/high-fat diets caused slight increases in HDL, and greater decreases in fasting triglycerides. At 12 to 24 months, however, the favorable effects on HDL persisted [34,36,37,41], while triglyceride levels were either reduced [34,36] or returned to baseline [37]. In a meta-analysis of trials comparing low-carbohydrate and low-fat diet groups, LDL levels were increased in the low-carbohydrate group [22]. There was no clear benefit of either low-fat or low-carbohydrate diet on cardiovascular risks. Favorable changes in HDL cholesterol and triglycerides should be weighed against potential unfavorable changes in LDL cholesterol.  Side effects - Very low-carbohydrate diets may be associated with more frequent side effects than low-fat diets. In one of the trials noted above, a number of symptoms occurred significantly more frequently in the low-carbohydrate compared to the low-fat diet group [33]. These included constipation (68 versus 35 percent), headache (60 versus 40 percent), halitosis (38 versus 8 percent), muscle cramps (35 versus 7 percent), diarrhea (23  versus 7 percent), general weakness (25 versus 8 percent), and rash (13 versus 0 percent) [33]. Despite the higher rate of symptoms, dropout rates in clinical trials have been similar for low-carbohydrate and low-fat diets [34-36]. Some have raised the concern about ketosis that occurs with very low-carbohydrate diets. There is one case report of an obese patient who presented in severe ketoacidosis, having lost 9 kg in one month on the Atkins diet, with intake restricted to meat, cheese, and salads [47]. Aside from her diet and possible mild dehydration due to gastroenteritis, no other cause for her ketoacidosis was identified. Weight maintenance diets - Although many individuals have success losing weight with diet, most subsequently regain much or all of the lost weight. Maintaining weight loss is made difficult by the reduction in energy expenditure that is induced by weight loss. In addition, long-term adherence to restrictive diets is difficult. Exercise and behavioral interventions may help individuals maintain weight loss. These strategies are reviewed in detail elsewhere. (See "Role of physical activity and exercise in obesity", section on 'Maintenance of weight loss' and "Behavioral strategies in the treatment of obesity", section on 'Maintenance of weight loss'.) There is little consensus on the optimal mix of macronutrients to maintain weight loss. The satiating effects of high protein, low glycemic index diets have generated interest in manipulating protein composition and glycemic index in weight maintenance diets. (See 'High protein diets' above and "Dietary carbohydrates", section on 'Effect of glycemic index/glycemic load'.) In a multicenter trial of five ad libitum diets to prevent weight regain over 26 weeks, 773 adults who had successfully lost 8 percent of their body weight on a low calorie diet (800 to 1000 kcal/day), were randomly assigned in a two-by-two factorial design to a high or  low-protein (25 versus 13 percent of total calories), high or low-glycemic index, or to a control diet (moderate protein content) [48]. All diets had a moderate fat content (25 to 30 percent). The achieved protein content was 5 percentage points higher in the high versus low protein groups, and the mean glycemic index was five units lower in the low-glycemic versus high-glycemic index groups. In the intention-to-treat analysis, weight regain during the trial was modestly but significantly greater in the low versus high-protein groups (mean difference 0.93 kg) and in the high versus low-glycemic index groups (mean difference 0.95 kg). Only subjects in the high-protein, low-glycemic index diet group continued to lose weight (mean change -0.38 kg).   The trial was limited by the moderate dropout rate (29 percent) and short-term follow-up (six months). Whether a low glycemic index, high protein diet is associated with long-term weight maintenance is unknown. As discussed above, long-term adherence to a weight maintaining diet is probably the most important determinant of success, and therefore the optimal weight maintaining diet will depend upon preference and individual factors. Role of dietary counseling - Dietary counseling may produce modest, short-term weight losses. This topic is reviewed in detail elsewhere. (See "Behavioral strategies in the treatment of obesity", section on 'Elements of behavioral strategies' and "Behavioral strategies in the treatment of obesity", section on 'Efficacy'.)  Prolonged caloric restriction and longevity - Prolonged caloric restriction improves longevity in rodents and non-human primates [49], but it is not known if the same is true in humans. It is hypothesized that the antiaging effects of caloric restriction are due to reduced energy expenditure resulting in a reduction in production of reactive oxygen species (and therefore a reduction in oxidative damage). In addition, other  metabolic effects associated with caloric restriction, such as improved insulin sensitivity, might also have an antiaging effect. In one trial of 48 sedentary, overweight men and women, six months of caloric restriction, with or without exercise, resulted in significant weight loss as expected [50]. In addition, calorie restriction-mediated reductions in fasting insulin concentrations, core body temperature, serum T3 levels, and oxidative damage to DNA (as reflected by a reduction in DNA fragmentation) were seen, suggesting a possible antiaging effect of the prolonged caloric restriction. INFORMATION FOR PATIENTS - UpToDate offers two types of patient education materials, "The Basics" and "Beyond the Basics." The Basics patient education pieces are written in plain language, at the 5th to 6th grade reading level, and they answer the four or five key questions a patient might have about a given condition. These articles are best for patients who want a general overview and who prefer short, easy-to-read materials. Beyond the Basics patient education pieces are longer, more sophisticated, and more detailed. These articles are written at the 10th to 12th grade reading level and are best for patients who want in-depth information and are comfortable with some medical jargon. Here are the patient education articles that are relevant to this topic. We encourage you to print or e-mail these topics to your patients. (You can also locate patient education articles on a variety of subjects by searching on "patient info" and the keyword(s) of interest.)  Basics topics (see "Patient information: Diet and health (The Basics)" and "Patient information: Weight loss treatments (The Basics)")  Beyond the Basics topics (see "Patient information: Diet and health (Beyond the Basics)" and "Patient information: Weight loss treatments (Beyond the Basics)" and "Patient information: Weight loss surgery (Beyond the Basics)")  SUMMARY  AND RECOMMENDATIONS An initial weight loss goal of 5 to 7 percent of body weight is realistic for most individuals. (See 'Goals of weight loss' above.)  Many types of diets produce modest weight loss. Options include balanced low-calorie, low-fat low-calorie, moderate-fat low calorie, low-carbohydrate diets, and the Mediterranean diet. Dietary adherence is an important predictor of weight loss, irrespective of the type of diet. (See 'Types of diets' above.)  We suggest tailoring a diet that reduces energy intake below energy expenditure to individual patient preferences, rather than focusing on the macronutrient composition of the diet (Grade 2B). (See 'Comparison trials' above.)  If a low-carbohydrate diet is chosen, healthy choices for fat (mono and polyunsaturated) and protein (fish, nuts, legumes, and poultry) should be encouraged. If a   low-fat diet is chosen, the decrease in fat should be accompanied by increases in healthy carbohydrates (fruits, vegetables, whole grHealth Maintenance, Females A healthy lifestyle and preventative care can promote health and wellness. Maintain regular health, dental, and eye exams.  Eat a healthy diet. Foods like vegetables, fruits, whole grains, low-fat dairy products, and lean protein foods contain the nutrients you need without too many calories. Decrease your intake of foods high in solid fats, added sugars, and salt. Get information about a proper diet from your caregiver, if necessary.  Regular physical exercise is one of the most important things you can do for your health. Most adults should get at least 150 minutes of moderate-intensity exercise (any activity that increases your heart rate and causes you to sweat) each week. In addition, most adults need muscle-strengthening exercises on 2 or more days a week.   Maintain a healthy weight. The body mass index (BMI) is a screening tool to identify possible weight problems. It provides an estimate of body fat  based on height and weight. Your caregiver can help determine your BMI, and can help you achieve or maintain a healthy weight. For adults 20 years and older:  A BMI below 18.5 is considered underweight.  A BMI of 18.5 to 24.9 is normal.  A BMI of 25 to 29.9 is considered overweight.  A BMI of 30 and above is considered obese.  Maintain normal blood lipids and cholesterol by exercising and minimizing your intake of saturated fat. Eat a balanced diet with plenty of fruits and vegetables. Blood tests for lipids and cholesterol should begin at age 55 and be repeated every 5 years. If your lipid or cholesterol levels are high, you are over 50, or you are a high risk for heart disease, you may need your cholesterol levels checked more frequently.Ongoing high lipid and cholesterol levels should be treated with medicines if diet and exercise are not effective.  If you smoke, find out from your caregiver how to quit. If you do not use tobacco, do not start.  If you are pregnant, do not drink alcohol. If you are breastfeeding, be very cautious about drinking alcohol. If you are not pregnant and choose to drink alcohol, do not exceed 1 drink per day. One drink is considered to be 12 ounces (355 mL) of beer, 5 ounces (148 mL) of wine, or 1.5 ounces (44 mL) of liquor.  Avoid use of street drugs. Do not share needles with anyone. Ask for help if you need support or instructions about stopping the use of drugs.  High blood pressure causes heart disease and increases the risk of stroke. Blood pressure should be checked at least every 1 to 2 years. Ongoing high blood pressure should be treated with medicines, if weight loss and exercise are not effective.  If you are 57 to 49 years old, ask your caregiver if you should take aspirin to prevent strokes.  Diabetes screening involves taking a blood sample to check your fasting blood sugar level. This should be done once every 3 years, after age 56, if you are within normal  weight and without risk factors for diabetes. Testing should be considered at a younger age or be carried out more frequently if you are overweight and have at least 1 risk factor for diabetes.  Breast cancer screening is essential preventative care for women. You should practice "breast self-awareness." This means understanding the normal appearance and feel of your breasts and may include breast self-examination. Any changes  detected, no matter how small, should be reported to a caregiver. Women in their 39s and 30s should have a clinical breast exam (CBE) by a caregiver as part of a regular health exam every 1 to 3 years. After age 59, women should have a CBE every year. Starting at age 3, women should consider having a mammogram (breast X-ray) every year. Women who have a family history of breast cancer should talk to their caregiver about genetic screening. Women at a high risk of breast cancer should talk to their caregiver about having an MRI and a mammogram every year.  The Pap test is a screening test for cervical cancer. Women should have a Pap test starting at age 27. Between ages 2 and 107, Pap tests should be repeated every 2 years. Beginning at age 2, you should have a Pap test every 3 years as long as the past 3 Pap tests have been normal. If you had a hysterectomy for a problem that was not cancer or a condition that could lead to cancer, then you no longer need Pap tests. If you are between ages 14 and 68, and you have had normal Pap tests going back 10 years, you no longer need Pap tests. If you have had past treatment for cervical cancer or a condition that could lead to cancer, you need Pap tests and screening for cancer for at least 20 years after your treatment. If Pap tests have been discontinued, risk factors (such as a new sexual partner) need to be reassessed to determine if screening should be resumed. Some women have medical problems that increase the chance of getting cervical  cancer. In these cases, your caregiver may recommend more frequent screening and Pap tests.  The human papillomavirus (HPV) test is an additional test that may be used for cervical cancer screening. The HPV test looks for the virus that can cause the cell changes on the cervix. The cells collected during the Pap test can be tested for HPV. The HPV test could be used to screen women aged 1 years and older, and should be used in women of any age who have unclear Pap test results. After the age of 67, women should have HPV testing at the same frequency as a Pap test.  Colorectal cancer can be detected and often prevented. Most routine colorectal cancer screening begins at the age of 2 and continues through age 75. However, your caregiver may recommend screening at an earlier age if you have risk factors for colon cancer. On a yearly basis, your caregiver may provide home test kits to check for hidden blood in the stool. Use of a small camera at the end of a tube, to directly examine the colon (sigmoidoscopy or colonoscopy), can detect the earliest forms of colorectal cancer. Talk to your caregiver about this at age 57, when routine screening begins. Direct examination of the colon should be repeated every 5 to 10 years through age 52, unless early forms of pre-cancerous polyps or small growths are found.  Hepatitis C blood testing is recommended for all people born from [birth year] through 1963 and any individual with known risks for hepatitis C.  Practice safe sex. Use condoms and avoid high-risk sexual practices to reduce the spread of sexually transmitted infections (STIs). Sexually active women aged 14 and younger should be checked for Chlamydia, which is a common sexually transmitted infection. Older women with new or multiple partners should also be tested for Chlamydia. Testing for other STIs is  recommended if you are sexually active and at increased risk.  Osteoporosis is a disease in which the bones lose  minerals and strength with aging. This can result in serious bone fractures. The risk of osteoporosis can be identified using a bone density scan. Women ages 58 and over and women at risk for fractures or osteoporosis should discuss screening with their caregivers. Ask your caregiver whether you should be taking a calcium supplement or vitamin D to reduce the rate of osteoporosis.  Menopause can be associated with physical symptoms and risks. Hormone replacement therapy is available to decrease symptoms and risks. You should talk to your caregiver about whether hormone replacement therapy is right for you.  Use sunscreen with a sun protection factor (SPF) of 30 or greater. Apply sunscreen liberally and repeatedly throughout the day. You should seek shade when your shadow is shorter than you. Protect yourself by wearing long sleeves, pants, a wide-brimmed hat, and sunglasses year round, whenever you are outdoors.  Notify your caregiver of new moles or changes in moles, especially if there is a change in shape or color. Also notify your caregiver if a mole is larger than the size of a pencil eraser.  Stay current with your immunizations.  Document Released: 09/13/2010 Document Revised: 02/17/2011 Document Reviewed: 09/13/2010 [facility] Patient Information 2012 [place], [place].

## 2011-10-05 NOTE — Progress Notes (Signed)
[name] 05/17/1960 [id number]   History:    51 y.o.  for annual gyn exam who had several issues to discuss today. Patient been complaining on off right lower quadrant discomfort. Review of her record indicated that last year she had a small 2.5 cm right ovarian cyst and was instructed to follow up in 3 months and she did not. She also been complaining of decreased libido. She denies any hot flashes irritability or mood swing. Her menstrual cycles reported to be normal. She is currently on low dose oral contraceptive pill. Patient in 2011 had a suburethral sling (Solyx) and now stated that recently on and off she's had some stress incontinence. She suffers from depression and is being followed by her therapist who has her on lithium and Lamictal. She also has gaining weight since last year. Her last mammogram was normal in 2012 and she does her monthly self breast examination. 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 Her followup Pap smears have been normal. She also suffers from insomnia for which she takes daily Sonata. Past medical history,surgical history, family history and social history were all reviewed and documented in the EPIC chart.  Gynecologic History Patient's last menstrual period was 09/28/2011. Contraception: OCP (estrogen/progesterone) Last Pap: 2012. Results were: normal Last mammogram: 2012. Results were: normal  Obstetric History OB History    Grav Para Term Preterm Abortions TAB SAB Ect Mult Living                   ROS: A ROS was performed and pertinent positives and negatives are included in the history.  GENERAL: No fevers or chills. HEENT: No change in vision, no earache, sore throat or sinus congestion. NECK: No pain or stiffness. CARDIOVASCULAR: No chest pain or pressure. No palpitations. PULMONARY: No shortness of breath, cough or wheeze. GASTROINTESTINAL: No abdominal pain, nausea,  vomiting or diarrhea, melena or bright red blood per rectum. GENITOURINARY: No urinary frequency, urgency, hesitancy or dysuria. MUSCULOSKELETAL: No joint or muscle pain, no back pain, no recent trauma. DERMATOLOGIC: No rash, no itching, no lesions. ENDOCRINE: No polyuria, polydipsia, no heat or cold intolerance. No recent change in weight. HEMATOLOGICAL: No anemia or easy bruising or bleeding. NEUROLOGIC: No headache, seizures, numbness, tingling or weakness. PSYCHIATRIC: No depression, no loss of interest in normal activity or change in sleep pattern.     Exam: chaperone present  BP 128/86  Ht 5\' 5"  (1.651 m)  Wt 183 lb (83.008 kg)  BMI 30.45 kg/m2  LMP 09/28/2011  Body mass index is 30.45 kg/(m^2).  General appearance : Well developed well nourished female. No acute distress HEENT: Neck supple, trachea midline, no carotid bruits, no thyroidmegaly Lungs: Clear to auscultation, no rhonchi or wheezes, or rib retractions  Heart: Regular rate and rhythm, no murmurs or gallops Breast:Examined in sitting and supine position were symmetrical in appearance, no palpable masses or tenderness,  no skin retraction, no nipple inversion, no nipple discharge, no skin discoloration, no axillary or supraclavicular lymphadenopathy Abdomen: no palpable masses or tenderness, no rebound or guarding Extremities: no edema or skin discoloration or tenderness  Pelvic:  Bartholin, Urethra, Skene Glands: Within normal limits             Vagina: No gross lesions or discharge  Cervix: No gross lesions or discharge  Uterus  anteverted, normal size, shape and consistency, non-tender and mobile  Adnexa  Without masses or tenderness  Anus  and perineum  normal   Rectovaginal  normal sphincter tone without palpated masses or tenderness             Hemoccult not done     Assessment/Plan:  51 y.o. female for annual exam will return back to the office next week for a pelvic ultrasound as a result of her on and off  right lower quadrant discomfort and past history of a right ovarian cyst. Review of her record also indicated that in 2007 she had a left ovarian cystectomy for dermoid cyst. Because her decreased libido we'll do a total testosterone level and check her FSH along with her CBC, cholesterol, hemoglobin A1c, urinalysis and TSH. New Pap smear screening guidelines discussed. No Pap smear done today. Q-tip angle test was less than 30 today. She did not leak on Valsalva in the office. She appears to have good support. We are going to give her Kaegel exercise instructions and continue to monitor. She was also reminded to schedule her mammogram for September. We also reminded her to do her monthly self breast examination. Literature information on weight reduction such as exercise and diet was provided as well.    Ok Edwards MD, 3:38 PM 10/05/2011

## 2011-10-06 ENCOUNTER — Other Ambulatory Visit: Payer: Self-pay | Admitting: Gynecology

## 2011-10-06 DIAGNOSIS — D72829 Elevated white blood cell count, unspecified: Secondary | ICD-10-CM

## 2011-10-06 LAB — URINALYSIS W MICROSCOPIC + REFLEX CULTURE
Bacteria, UA: NONE SEEN
Bilirubin Urine: NEGATIVE
Casts: NONE SEEN
Crystals: NONE SEEN
Ketones, ur: 15 mg/dL — AB
Specific Gravity, Urine: 1.025 (ref 1.005–1.030)
Urobilinogen, UA: 0.2 mg/dL (ref 0.0–1.0)

## 2011-10-06 LAB — FOLLICLE STIMULATING HORMONE: FSH: 6.3 m[IU]/mL

## 2011-10-06 LAB — TSH: TSH: 1.643 u[IU]/mL (ref 0.350–4.500)

## 2011-10-28 ENCOUNTER — Telehealth: Payer: Self-pay | Admitting: *Deleted

## 2011-10-28 MED ORDER — LEVONORGESTREL-ETHINYL ESTRAD 0.1-20 MG-MCG PO TABS: 1.0000 | ORAL_TABLET | Freq: Every day | ORAL | Status: DC

## 2011-10-28 NOTE — Telephone Encounter (Signed)
Pharmacy placed pt birth control pills back, pt took too long to pick up rx, rx re-sent.

## 2011-11-07 ENCOUNTER — Encounter: Payer: Self-pay | Admitting: Gynecology

## 2011-11-07 ENCOUNTER — Ambulatory Visit (INDEPENDENT_AMBULATORY_CARE_PROVIDER_SITE_OTHER): Payer: BC Managed Care – PPO | Admitting: Gynecology

## 2011-11-07 ENCOUNTER — Ambulatory Visit (INDEPENDENT_AMBULATORY_CARE_PROVIDER_SITE_OTHER): Payer: BC Managed Care – PPO

## 2011-11-07 VITALS — BP 130/84

## 2011-11-07 DIAGNOSIS — R102 Pelvic and perineal pain: Secondary | ICD-10-CM

## 2011-11-07 DIAGNOSIS — N83201 Unspecified ovarian cyst, right side: Secondary | ICD-10-CM

## 2011-11-07 DIAGNOSIS — N83209 Unspecified ovarian cyst, unspecified side: Secondary | ICD-10-CM

## 2011-11-07 DIAGNOSIS — Z8742 Personal history of other diseases of the female genital tract: Secondary | ICD-10-CM

## 2011-11-07 DIAGNOSIS — N831 Corpus luteum cyst of ovary, unspecified side: Secondary | ICD-10-CM

## 2011-11-07 DIAGNOSIS — N949 Unspecified condition associated with female genital organs and menstrual cycle: Secondary | ICD-10-CM

## 2011-11-07 NOTE — Progress Notes (Signed)
Patient presented to the office today to discuss results that was ordered for today. She was seen in the office on July 24 complaining of right lower quadrant discomfort on and off.Review of her record indicated that last year she had a small 2.5 cm right ovarian cyst and was instructed to follow up in 3 months and she did not. She also been complaining times of decreased libido. Review of her records indicated that she has had history of exploratory laparotomy as well as left ovarian cystectomy for dermoid cyst. Recent lab results indicate the following:  Blood sugar, cholesterol, CBC, FSH, TSH, total testosterone and urinalysis were all normal.  Ultrasound today: Uterus measures 7.7 x 4.3 x 2.7 cm endometrial stripe of 3.1 mm. Right ovary thickwalled cyst measuring 27 x 23 x 25 mm was noted within the cyst there was a solid focus of the wall of the cyst measuring 10 x 8 mm suggestive ovarian tissue not seen in the sagittal images. Negative cul-de-sac. Left ovary with a thick wall cyst irregular lumen measuring 16 x 15 mm negative color flow.  The above findings were discussed with the patient. Patient would like to wait before any surgical intervention due to the fact her husband getting rate had knee surgery. Will keep her on oral contraceptive pill to have it take continuously and will repeat the ultrasound in 3 months and will check a CA 125 today. Limitations of CA 125 were also discussed with the husband and wife.

## 2011-11-07 NOTE — Patient Instructions (Addendum)
Ovarian Cyst The ovaries are small organs that are on each side of the uterus. The ovaries are the organs that produce the female hormones, estrogen and progesterone. An ovarian cyst is a sac filled with fluid that can vary in its size. It is normal for a small cyst to form in women who are in the childbearing age and who have menstrual periods. This type of cyst is called a follicle cyst that becomes an ovulation cyst (corpus luteum cyst) after it produces the women's egg. It later goes away on its own if the woman does not become pregnant. There are other kinds of ovarian cysts that may cause problems and may need to be treated. The most serious problem is a cyst with cancer. It should be noted that menopausal women who have an ovarian cyst are at a higher risk of it being a cancer cyst. They should be evaluated very quickly, thoroughly and followed closely. This is especially true in menopausal women because of the high rate of ovarian cancer in women in menopause. CAUSES AND TYPES OF OVARIAN CYSTS:  FUNCTIONAL CYST: The follicle/corpus luteum cyst is a functional cyst that occurs every month during ovulation with the menstrual cycle. They go away with the next menstrual cycle if the woman does not get pregnant. Usually, there are no symptoms with a functional cyst.   ENDOMETRIOMA CYST: This cyst develops from the lining of the uterus tissue. This cyst gets in or on the ovary. It grows every month from the bleeding during the menstrual period. It is also called a "chocolate cyst" because it becomes filled with blood that turns brown. This cyst can cause pain in the lower abdomen during intercourse and with your menstrual period.   CYSTADENOMA CYST: This cyst develops from the cells on the outside of the ovary. They usually are not cancerous. They can get very big and cause lower abdomen pain and pain with intercourse. This type of cyst can twist on itself, cut off its blood supply and cause severe pain.  It also can easily rupture and cause a lot of pain.   DERMOID CYST: This type of cyst is sometimes found in both ovaries. They are found to have different kinds of body tissue in the cyst. The tissue includes skin, teeth, hair, and/or cartilage. They usually do not have symptoms unless they get very big. Dermoid cysts are rarely cancerous.   POLYCYSTIC OVARY: This is a rare condition with hormone problems that produces many small cysts on both ovaries. The cysts are follicle-like cysts that never produce an egg and become a corpus luteum. It can cause an increase in body weight, infertility, acne, increase in body and facial hair and lack of menstrual periods or rare menstrual periods. Many women with this problem develop type 2 diabetes. The exact cause of this problem is unknown. A polycystic ovary is rarely cancerous.   THECA LUTEIN CYST: Occurs when too much hormone (human chorionic gonadotropin) is produced and over-stimulates the ovaries to produce an egg. They are frequently seen when doctors stimulate the ovaries for invitro-fertilization (test tube babies).   LUTEOMA CYST: This cyst is seen during pregnancy. Rarely it can cause an obstruction to the birth canal during labor and delivery. They usually go away after delivery.  SYMPTOMS   Pelvic pain or pressure.   Pain during sexual intercourse.   Increasing girth (swelling) of the abdomen.   Abnormal menstrual periods.   Increasing pain with menstrual periods.   You stop having   menstrual periods and you are not pregnant.  DIAGNOSIS  The diagnosis can be made during:  Routine or annual pelvic examination (common).   Ultrasound.   X-ray of the pelvis.   CT Scan.   MRI.   Blood tests.  TREATMENT   Treatment may only be to follow the cyst monthly for 2 to 3 months with your caregiver. Many go away on their own, especially functional cysts.   May be aspirated (drained) with a long needle with ultrasound, or by laparoscopy  (inserting a tube into the pelvis through a small incision).   The whole cyst can be removed by laparoscopy.   Sometimes the cyst may need to be removed through an incision in the lower abdomen.   Hormone treatment is sometimes used to help dissolve certain cysts.   Birth control pills are sometimes used to help dissolve certain cysts.  HOME CARE INSTRUCTIONS  Follow your caregiver's advice regarding:  Medicine.   Follow up visits to evaluate and treat the cyst.   You may need to come back or make an appointment with another caregiver, to find the exact cause of your cyst, if your caregiver is not a gynecologist.   Get your yearly and recommended pelvic examinations and Pap tests.   Let your caregiver know if you have had an ovarian cyst in the past.  SEEK MEDICAL CARE IF:   Your periods are late, irregular, they stop, or are painful.   Your stomach (abdomen) or pelvic pain does not go away.   Your stomach becomes larger or swollen.   You have pressure on your bladder or trouble emptying your bladder completely.   You have painful sexual intercourse.   You have feelings of fullness, pressure, or discomfort in your stomach.   You lose weight for no apparent reason.   You feel generally ill.   You become constipated.   You lose your appetite.   You develop acne.   You have an increase in body and facial hair.   You are gaining weight, without changing your exercise and eating habits.   You think you are pregnant.  SEEK IMMEDIATE MEDICAL CARE IF:   You have increasing abdominal pain.   You feel sick to your stomach (nausea) and/or vomit.   You develop a fever that comes on suddenly.   You develop abdominal pain during a bowel movement.   Your menstrual periods become heavier than usual.  Document Released: 02/28/2005 Document Revised: 02/17/2011 Document Reviewed: 01/01/2009 [employer] Patient Information 2012 [employer]. 

## 2011-11-28 ENCOUNTER — Telehealth: Payer: Self-pay | Admitting: *Deleted

## 2011-11-28 MED ORDER — LEVONORGESTREL-ETHINYL ESTRAD 0.1-20 MG-MCG PO TABS: 1.0000 | ORAL_TABLET | Freq: Every day | ORAL | Status: DC

## 2011-11-28 NOTE — Telephone Encounter (Signed)
Pt called requesting 1 pack of AVIANE called into pharmacy. Pt takes take continuously. Per office note okay to send rx on 11/07/11

## 2011-12-26 ENCOUNTER — Other Ambulatory Visit: Payer: Self-pay | Admitting: Gynecology

## 2011-12-26 DIAGNOSIS — Z1231 Encounter for screening mammogram for malignant neoplasm of breast: Secondary | ICD-10-CM

## 2012-01-13 ENCOUNTER — Ambulatory Visit (INDEPENDENT_AMBULATORY_CARE_PROVIDER_SITE_OTHER): Payer: BC Managed Care – PPO

## 2012-01-13 ENCOUNTER — Ambulatory Visit (INDEPENDENT_AMBULATORY_CARE_PROVIDER_SITE_OTHER): Payer: BC Managed Care – PPO | Admitting: Gynecology

## 2012-01-13 ENCOUNTER — Ambulatory Visit
Admission: RE | Admit: 2012-01-13 | Discharge: 2012-01-13 | Disposition: A | Discharge status: Home / Self Care | Payer: BC Managed Care – PPO | Source: Ambulatory Visit | Attending: Gynecology | Admitting: Gynecology

## 2012-01-13 ENCOUNTER — Encounter: Payer: Self-pay | Admitting: Gynecology

## 2012-01-13 DIAGNOSIS — R102 Pelvic and perineal pain: Secondary | ICD-10-CM

## 2012-01-13 DIAGNOSIS — N83201 Unspecified ovarian cyst, right side: Secondary | ICD-10-CM

## 2012-01-13 DIAGNOSIS — Z1231 Encounter for screening mammogram for malignant neoplasm of breast: Secondary | ICD-10-CM

## 2012-01-13 DIAGNOSIS — N83209 Unspecified ovarian cyst, unspecified side: Secondary | ICD-10-CM

## 2012-01-13 DIAGNOSIS — N949 Unspecified condition associated with female genital organs and menstrual cycle: Secondary | ICD-10-CM

## 2012-01-13 DIAGNOSIS — R1031 Right lower quadrant pain: Secondary | ICD-10-CM

## 2012-01-13 NOTE — Patient Instructions (Addendum)
Ovarian Cyst The ovaries are small organs that are on each side of the uterus. The ovaries are the organs that produce the female hormones, estrogen and progesterone. An ovarian cyst is a sac filled with fluid that can vary in its size. It is normal for a small cyst to form in women who are in the childbearing age and who have menstrual periods. This type of cyst is called a follicle cyst that becomes an ovulation cyst (corpus luteum cyst) after it produces the women's egg. It later goes away on its own if the woman does not become pregnant. There are other kinds of ovarian cysts that may cause problems and may need to be treated. The most serious problem is a cyst with cancer. It should be noted that menopausal women who have an ovarian cyst are at a higher risk of it being a cancer cyst. They should be evaluated very quickly, thoroughly and followed closely. This is especially true in menopausal women because of the high rate of ovarian cancer in women in menopause. CAUSES AND TYPES OF OVARIAN CYSTS:  FUNCTIONAL CYST: The follicle/corpus luteum cyst is a functional cyst that occurs every month during ovulation with the menstrual cycle. They go away with the next menstrual cycle if the woman does not get pregnant. Usually, there are no symptoms with a functional cyst.  ENDOMETRIOMA CYST: This cyst develops from the lining of the uterus tissue. This cyst gets in or on the ovary. It grows every month from the bleeding during the menstrual period. It is also called a "chocolate cyst" because it becomes filled with blood that turns brown. This cyst can cause pain in the lower abdomen during intercourse and with your menstrual period.  CYSTADENOMA CYST: This cyst develops from the cells on the outside of the ovary. They usually are not cancerous. They can get very big and cause lower abdomen pain and pain with intercourse. This type of cyst can twist on itself, cut off its blood supply and cause severe pain. It  also can easily rupture and cause a lot of pain.  DERMOID CYST: This type of cyst is sometimes found in both ovaries. They are found to have different kinds of body tissue in the cyst. The tissue includes skin, teeth, hair, and/or cartilage. They usually do not have symptoms unless they get very big. Dermoid cysts are rarely cancerous.  POLYCYSTIC OVARY: This is a rare condition with hormone problems that produces many small cysts on both ovaries. The cysts are follicle-like cysts that never produce an egg and become a corpus luteum. It can cause an increase in body weight, infertility, acne, increase in body and facial hair and lack of menstrual periods or rare menstrual periods. Many women with this problem develop type 2 diabetes. The exact cause of this problem is unknown. A polycystic ovary is rarely cancerous.  THECA LUTEIN CYST: Occurs when too much hormone (human chorionic gonadotropin) is produced and over-stimulates the ovaries to produce an egg. They are frequently seen when doctors stimulate the ovaries for invitro-fertilization (test tube babies).  LUTEOMA CYST: This cyst is seen during pregnancy. Rarely it can cause an obstruction to the birth canal during labor and delivery. They usually go away after delivery. SYMPTOMS   Pelvic pain or pressure.  Pain during sexual intercourse.  Increasing girth (swelling) of the abdomen.  Abnormal menstrual periods.  Increasing pain with menstrual periods.  You stop having menstrual periods and you are not pregnant. DIAGNOSIS  The diagnosis can   be made during:  Routine or annual pelvic examination (common).  Ultrasound.  X-ray of the pelvis.  CT Scan.  MRI.  Blood tests. TREATMENT   Treatment may only be to follow the cyst monthly for 2 to 3 months with your caregiver. Many go away on their own, especially functional cysts.  May be aspirated (drained) with a long needle with ultrasound, or by laparoscopy (inserting a tube into  the pelvis through a small incision).  The whole cyst can be removed by laparoscopy.  Sometimes the cyst may need to be removed through an incision in the lower abdomen.  Hormone treatment is sometimes used to help dissolve certain cysts.  Birth control pills are sometimes used to help dissolve certain cysts. HOME CARE INSTRUCTIONS  Follow your caregiver's advice regarding:  Medicine.  Follow up visits to evaluate and treat the cyst.  You may need to come back or make an appointment with another caregiver, to find the exact cause of your cyst, if your caregiver is not a gynecologist.  Get your yearly and recommended pelvic examinations and Pap tests.  Let your caregiver know if you have had an ovarian cyst in the past. SEEK MEDICAL CARE IF:   Your periods are late, irregular, they stop, or are painful.  Your stomach (abdomen) or pelvic pain does not go away.  Your stomach becomes larger or swollen.  You have pressure on your bladder or trouble emptying your bladder completely.  You have painful sexual intercourse.  You have feelings of fullness, pressure, or discomfort in your stomach.  You lose weight for no apparent reason.  You feel generally ill.  You become constipated.  You lose your appetite.  You develop acne.  You have an increase in body and facial hair.  You are gaining weight, without changing your exercise and eating habits.  You think you are pregnant. SEEK IMMEDIATE MEDICAL CARE IF:   You have increasing abdominal pain.  You feel sick to your stomach (nausea) and/or vomit.  You develop a fever that comes on suddenly.  You develop abdominal pain during a bowel movement.  Your menstrual periods become heavier than usual. Document Released: 02/28/2005 Document Revised: 05/23/2011 Document Reviewed: 01/01/2009 [employer] Patient Information 2013 [employer].  

## 2012-01-13 NOTE — Progress Notes (Signed)
She was seen in the office on July 24 complaining of right lower quadrant discomfort on and off.Review of her record indicated that last year she had a small 2.5 cm right ovarian cyst and was instructed to follow up in 3 months and she did not. She also had been complaining at times of decreased libido. Review of her records indicated that she has had history of exploratory laparotomy as well as left ovarian cystectomy for dermoid cyst. Recent lab results indicate the following:   Blood sugar, cholesterol, CBC, FSH, TSH, total testosterone and urinalysis were all normal.   Ultrasound 11/07/2011:  Uterus measures 7.7 x 4.3 x 2.7 cm endometrial stripe of 3.1 mm. Right ovary thickwalled cyst measuring 27 x 23 x 25 mm was noted within the cyst there was a solid focus of the wall of the cyst measuring 10 x 8 mm suggestive ovarian tissue not seen in the sagittal images. Negative cul-de-sac. Left ovary with a thick wall cyst irregular lumen measuring 16 x 15 mm negative color flow.  Patient had a normal CA 125 patient been fully aware of its limitations. And was to return back in 3 months which she did today for followup ultrasound. The ultrasound today demonstrated uterus measures 7.9 x 4.4 x 2.9 cm with endometrial stripe of 4.5 mm. Left ovary appears to be normal with a small follicle right ovarian echo-free thinwall avascular cyst with a thin septum measuring 19 x 17 mm no free fluid was seen. Slightly smaller than the previous scan.  When asked the patient states her symptoms have gotten slightly better they come and go infrequent but sometimes every day. She was offered laparoscopic ovarian cystectomy possible salpingo-oophorectomy and states it is not that bad she would like to continue to wait and watch. I told her I could give no guarantees but by the appearance and based on the CA1 25 this is most likely a very small simple cyst and it has gotten smaller. She is still currently on a 20 mcg oral  contraceptive pill and will continue to be on it. She scheduled to return back next July for her annual exam and we could followup with an ultrasound that point or she can return any time of her symptoms worsen and she would like to proceed with laparoscopic surgery. Literature information on ovarian cyst was provided as well today.

## 2012-10-26 ENCOUNTER — Encounter: Payer: Self-pay | Admitting: Gynecology

## 2012-10-26 ENCOUNTER — Ambulatory Visit (INDEPENDENT_AMBULATORY_CARE_PROVIDER_SITE_OTHER): Payer: Federal, State, Local not specified - PPO | Admitting: Gynecology

## 2012-10-26 VITALS — BP 124/74 | Ht 66.0 in | Wt 208.0 lb

## 2012-10-26 DIAGNOSIS — F411 Generalized anxiety disorder: Secondary | ICD-10-CM

## 2012-10-26 DIAGNOSIS — Z23 Encounter for immunization: Secondary | ICD-10-CM

## 2012-10-26 DIAGNOSIS — Z01419 Encounter for gynecological examination (general) (routine) without abnormal findings: Secondary | ICD-10-CM

## 2012-10-26 DIAGNOSIS — F419 Anxiety disorder, unspecified: Secondary | ICD-10-CM | POA: Insufficient documentation

## 2012-10-26 MED ORDER — LEVONORGESTREL-ETHINYL ESTRAD 0.1-20 MG-MCG PO TABS: 1.0000 | ORAL_TABLET | Freq: Every day | ORAL | Status: DC

## 2012-10-26 NOTE — Patient Instructions (Signed)

## 2012-10-26 NOTE — Progress Notes (Signed)
[name] 1960/03/29 [id number]   History:    52 y.o.  for annual gyn exam with no complaints today. She is currently on low dose oral contraceptive pill. Patient in 2011 had a suburethral sling (Solyx) . She suffers from depression and is being followed by her therapist who has her on lithium and Lamictal. She also has gaining weight since last year. 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 Her followup Pap smears have been normal. She also suffers from insomnia for which she takes daily Sonata. Patient's last mammogram was in 2013 was described as normal but dense. . Patient has informed me that this year in [place], [place] Dr. June Leap removed a benign polyp via colonoscopy. Patient has not received the Tdap vaccine as of yet.    Past medical history,surgical history, family history and social history were all reviewed and documented in the EPIC chart.  Gynecologic History Patient's last menstrual period was 10/12/2012. Contraception: OCP (estrogen/progesterone) Last Pap: 2012. Results were: normal Last mammogram: 2013. Results were: as described above  Obstetric History OB History  Gravida Para Term Preterm AB SAB TAB Ectopic Multiple Living  0         0         ROS: A ROS was performed and pertinent positives and negatives are included in the history.  GENERAL: No fevers or chills. HEENT: No change in vision, no earache, sore throat or sinus congestion. NECK: No pain or stiffness. CARDIOVASCULAR: No chest pain or pressure. No palpitations. PULMONARY: No shortness of breath, cough or wheeze. GASTROINTESTINAL: No abdominal pain, nausea, vomiting or diarrhea, melena or bright red blood per rectum. GENITOURINARY: No urinary frequency, urgency, hesitancy or dysuria. MUSCULOSKELETAL: No joint or muscle pain, no back pain, no recent trauma. DERMATOLOGIC: No rash, no itching, no lesions. ENDOCRINE: No  polyuria, polydipsia, no heat or cold intolerance. No recent change in weight. HEMATOLOGICAL: No anemia or easy bruising or bleeding. NEUROLOGIC: No headache, seizures, numbness, tingling or weakness. PSYCHIATRIC: No depression, no loss of interest in normal activity or change in sleep pattern.     Exam: chaperone present  BP 124/74  Ht 5\' 6"  (1.676 m)  Wt 208 lb (94.348 kg)  BMI 33.59 kg/m2  LMP 10/12/2012  Body mass index is 33.59 kg/(m^2).  General appearance : Well developed well nourished female. No acute distress HEENT: Neck supple, trachea midline, no carotid bruits, no thyroidmegaly Lungs: Clear to auscultation, no rhonchi or wheezes, or rib retractions  Heart: Regular rate and rhythm, no murmurs or gallops Breast:Examined in sitting and supine position were symmetrical in appearance, no palpable masses or tenderness,  no skin retraction, no nipple inversion, no nipple discharge, no skin discoloration, no axillary or supraclavicular lymphadenopathy Abdomen: no palpable masses or tenderness, no rebound or guarding Extremities: no edema or skin discoloration or tenderness  Pelvic:  Bartholin, Urethra, Skene Glands: Within normal limits             Vagina: No gross lesions or discharge  Cervix: No gross lesions or discharge  Uterus  anteverted, normal size, shape and consistency, non-tender and mobile  Adnexa  Without masses or tenderness  Anus and perineum  normal   Rectovaginal  normal sphincter tone without palpated masses or tenderness             Hemoccult none indicated     Assessment/Plan:  52 y.o. female for annual exam doing well  on low dose oral contraceptive pill. Primary physician and psychiatrist doing patient blood work. No blood work done today. The new Pap smear screening guidelines discussed. No Pap smear done today. Patient did receive the Tdap vaccine today. Patient was reminded to schedule her mammogram and to request a 3-D because of her dense  breast.    Ok Edwards MD, 1:43 PM 10/26/2012

## 2012-11-08 ENCOUNTER — Other Ambulatory Visit: Payer: Self-pay | Admitting: Gynecology

## 2013-04-08 ENCOUNTER — Other Ambulatory Visit: Payer: Self-pay

## 2013-04-08 DIAGNOSIS — Z1231 Encounter for screening mammogram for malignant neoplasm of breast: Secondary | ICD-10-CM

## 2013-05-09 ENCOUNTER — Ambulatory Visit: Payer: BC Managed Care – PPO

## 2013-06-18 ENCOUNTER — Ambulatory Visit
Admission: RE | Admit: 2013-06-18 | Discharge: 2013-06-18 | Disposition: A | Discharge status: Home / Self Care | Payer: Federal, State, Local not specified - PPO | Source: Ambulatory Visit

## 2013-06-18 DIAGNOSIS — Z1231 Encounter for screening mammogram for malignant neoplasm of breast: Secondary | ICD-10-CM

## 2013-10-21 ENCOUNTER — Other Ambulatory Visit: Payer: Self-pay | Admitting: Gynecology

## 2013-10-31 ENCOUNTER — Encounter: Payer: Federal, State, Local not specified - PPO | Admitting: Gynecology

## 2013-11-05 DIAGNOSIS — Z0289 Encounter for other administrative examinations: Secondary | ICD-10-CM

## 2013-11-19 ENCOUNTER — Other Ambulatory Visit: Payer: Self-pay | Admitting: Gynecology

## 2014-01-03 ENCOUNTER — Other Ambulatory Visit: Payer: Self-pay | Admitting: Gynecology

## 2014-09-02 ENCOUNTER — Other Ambulatory Visit: Payer: Self-pay

## 2014-09-02 DIAGNOSIS — Z1231 Encounter for screening mammogram for malignant neoplasm of breast: Secondary | ICD-10-CM

## 2014-09-16 ENCOUNTER — Ambulatory Visit
Admission: RE | Admit: 2014-09-16 | Discharge: 2014-09-16 | Disposition: A | Discharge status: Home / Self Care | Payer: Federal, State, Local not specified - PPO | Source: Ambulatory Visit

## 2014-09-16 DIAGNOSIS — Z1231 Encounter for screening mammogram for malignant neoplasm of breast: Secondary | ICD-10-CM

## 2015-03-03 DIAGNOSIS — N941 Unspecified dyspareunia: Secondary | ICD-10-CM | POA: Insufficient documentation

## 2015-03-03 DIAGNOSIS — F418 Other specified anxiety disorders: Secondary | ICD-10-CM | POA: Insufficient documentation

## 2015-08-21 DIAGNOSIS — Z79899 Other long term (current) drug therapy: Secondary | ICD-10-CM | POA: Diagnosis not present

## 2015-08-21 DIAGNOSIS — Z13220 Encounter for screening for lipoid disorders: Secondary | ICD-10-CM | POA: Diagnosis not present

## 2015-08-21 DIAGNOSIS — Z0000 Encounter for general adult medical examination without abnormal findings: Secondary | ICD-10-CM | POA: Diagnosis not present

## 2015-08-21 DIAGNOSIS — R5383 Other fatigue: Secondary | ICD-10-CM | POA: Diagnosis not present

## 2015-10-23 ENCOUNTER — Other Ambulatory Visit: Payer: Self-pay | Admitting: Obstetrics and Gynecology

## 2015-10-23 DIAGNOSIS — Z1231 Encounter for screening mammogram for malignant neoplasm of breast: Secondary | ICD-10-CM

## 2015-10-27 DIAGNOSIS — N393 Stress incontinence (female) (male): Secondary | ICD-10-CM | POA: Diagnosis not present

## 2015-12-17 ENCOUNTER — Ambulatory Visit
Admission: RE | Admit: 2015-12-17 | Discharge: 2015-12-17 | Disposition: A | Discharge status: Home / Self Care | Payer: Federal, State, Local not specified - PPO | Source: Ambulatory Visit | Attending: Obstetrics and Gynecology | Admitting: Obstetrics and Gynecology

## 2015-12-17 DIAGNOSIS — Z1231 Encounter for screening mammogram for malignant neoplasm of breast: Secondary | ICD-10-CM | POA: Diagnosis not present

## 2015-12-22 ENCOUNTER — Other Ambulatory Visit: Payer: Self-pay | Admitting: Obstetrics and Gynecology

## 2015-12-22 DIAGNOSIS — R928 Other abnormal and inconclusive findings on diagnostic imaging of breast: Secondary | ICD-10-CM

## 2015-12-25 ENCOUNTER — Ambulatory Visit
Admission: RE | Admit: 2015-12-25 | Discharge: 2015-12-25 | Disposition: A | Discharge status: Home / Self Care | Payer: Federal, State, Local not specified - PPO | Source: Ambulatory Visit | Attending: Obstetrics and Gynecology | Admitting: Obstetrics and Gynecology

## 2015-12-25 DIAGNOSIS — R928 Other abnormal and inconclusive findings on diagnostic imaging of breast: Secondary | ICD-10-CM

## 2015-12-25 DIAGNOSIS — R922 Inconclusive mammogram: Secondary | ICD-10-CM | POA: Diagnosis not present

## 2016-01-11 DIAGNOSIS — N393 Stress incontinence (female) (male): Secondary | ICD-10-CM | POA: Diagnosis not present

## 2016-01-28 DIAGNOSIS — R590 Localized enlarged lymph nodes: Secondary | ICD-10-CM | POA: Diagnosis not present

## 2016-01-28 DIAGNOSIS — Z1389 Encounter for screening for other disorder: Secondary | ICD-10-CM | POA: Diagnosis not present

## 2016-02-11 DIAGNOSIS — M19012 Primary osteoarthritis, left shoulder: Secondary | ICD-10-CM | POA: Diagnosis not present

## 2016-02-11 DIAGNOSIS — M89319 Hypertrophy of bone, unspecified shoulder: Secondary | ICD-10-CM | POA: Diagnosis not present

## 2016-02-11 DIAGNOSIS — M19011 Primary osteoarthritis, right shoulder: Secondary | ICD-10-CM | POA: Diagnosis not present

## 2016-02-11 DIAGNOSIS — Z6835 Body mass index (BMI) 35.0-35.9, adult: Secondary | ICD-10-CM | POA: Diagnosis not present

## 2016-02-17 DIAGNOSIS — F411 Generalized anxiety disorder: Secondary | ICD-10-CM | POA: Diagnosis not present

## 2016-02-23 DIAGNOSIS — Z01818 Encounter for other preprocedural examination: Secondary | ICD-10-CM | POA: Diagnosis not present

## 2016-02-26 DIAGNOSIS — N393 Stress incontinence (female) (male): Secondary | ICD-10-CM | POA: Diagnosis not present

## 2016-03-23 DIAGNOSIS — F39 Unspecified mood [affective] disorder: Secondary | ICD-10-CM | POA: Diagnosis not present

## 2016-03-23 DIAGNOSIS — R6884 Jaw pain: Secondary | ICD-10-CM | POA: Diagnosis not present

## 2016-05-17 DIAGNOSIS — Z6837 Body mass index (BMI) 37.0-37.9, adult: Secondary | ICD-10-CM | POA: Diagnosis not present

## 2016-05-17 DIAGNOSIS — M89319 Hypertrophy of bone, unspecified shoulder: Secondary | ICD-10-CM | POA: Diagnosis not present

## 2016-07-27 ENCOUNTER — Encounter: Payer: Self-pay | Admitting: Gynecology

## 2016-12-07 DIAGNOSIS — Z1231 Encounter for screening mammogram for malignant neoplasm of breast: Secondary | ICD-10-CM | POA: Diagnosis not present

## 2016-12-07 DIAGNOSIS — Z87898 Personal history of other specified conditions: Secondary | ICD-10-CM | POA: Diagnosis not present

## 2016-12-07 DIAGNOSIS — Z01419 Encounter for gynecological examination (general) (routine) without abnormal findings: Secondary | ICD-10-CM | POA: Diagnosis not present

## 2016-12-07 DIAGNOSIS — R922 Inconclusive mammogram: Secondary | ICD-10-CM | POA: Diagnosis not present

## 2016-12-09 ENCOUNTER — Other Ambulatory Visit: Payer: Self-pay | Admitting: Obstetrics and Gynecology

## 2016-12-09 DIAGNOSIS — Z1231 Encounter for screening mammogram for malignant neoplasm of breast: Secondary | ICD-10-CM

## 2016-12-27 ENCOUNTER — Ambulatory Visit
Admission: RE | Admit: 2016-12-27 | Discharge: 2016-12-27 | Disposition: A | Discharge status: Home / Self Care | Payer: Federal, State, Local not specified - PPO | Source: Ambulatory Visit | Attending: Obstetrics and Gynecology | Admitting: Obstetrics and Gynecology

## 2016-12-27 DIAGNOSIS — Z1231 Encounter for screening mammogram for malignant neoplasm of breast: Secondary | ICD-10-CM

## 2017-01-12 DIAGNOSIS — M79651 Pain in right thigh: Secondary | ICD-10-CM | POA: Diagnosis not present

## 2017-01-12 DIAGNOSIS — Z6837 Body mass index (BMI) 37.0-37.9, adult: Secondary | ICD-10-CM | POA: Diagnosis not present

## 2017-02-09 DIAGNOSIS — J209 Acute bronchitis, unspecified: Secondary | ICD-10-CM | POA: Diagnosis not present

## 2017-02-09 DIAGNOSIS — Z6837 Body mass index (BMI) 37.0-37.9, adult: Secondary | ICD-10-CM | POA: Diagnosis not present

## 2018-03-12 ENCOUNTER — Encounter: Payer: Self-pay | Admitting: Cardiology

## 2018-03-12 DIAGNOSIS — R002 Palpitations: Secondary | ICD-10-CM | POA: Diagnosis not present

## 2018-03-12 DIAGNOSIS — Z6837 Body mass index (BMI) 37.0-37.9, adult: Secondary | ICD-10-CM | POA: Diagnosis not present

## 2018-03-12 DIAGNOSIS — Z23 Encounter for immunization: Secondary | ICD-10-CM | POA: Diagnosis not present

## 2018-03-12 DIAGNOSIS — R5383 Other fatigue: Secondary | ICD-10-CM | POA: Diagnosis not present

## 2018-03-12 DIAGNOSIS — E785 Hyperlipidemia, unspecified: Secondary | ICD-10-CM | POA: Diagnosis not present

## 2018-03-12 DIAGNOSIS — K219 Gastro-esophageal reflux disease without esophagitis: Secondary | ICD-10-CM | POA: Diagnosis not present

## 2018-03-23 ENCOUNTER — Ambulatory Visit: Payer: Federal, State, Local not specified - PPO | Admitting: Cardiology

## 2018-03-23 ENCOUNTER — Encounter: Payer: Self-pay | Admitting: Cardiology

## 2018-03-23 ENCOUNTER — Ambulatory Visit (INDEPENDENT_AMBULATORY_CARE_PROVIDER_SITE_OTHER): Payer: Federal, State, Local not specified - PPO

## 2018-03-23 VITALS — BP 120/80 | HR 77 | Ht 66.0 in | Wt 235.4 lb

## 2018-03-23 DIAGNOSIS — R0600 Dyspnea, unspecified: Secondary | ICD-10-CM

## 2018-03-23 DIAGNOSIS — R0609 Other forms of dyspnea: Secondary | ICD-10-CM | POA: Diagnosis not present

## 2018-03-23 DIAGNOSIS — R002 Palpitations: Secondary | ICD-10-CM

## 2018-03-23 DIAGNOSIS — R635 Abnormal weight gain: Secondary | ICD-10-CM | POA: Diagnosis not present

## 2018-03-23 LAB — ECHOCARDIOGRAM COMPLETE
Height: 66 in
Weight: 3766.4 oz

## 2018-03-23 NOTE — Progress Notes (Signed)
Complete echocardiogram has been performed.  Jimmy Clyde Upshaw RDCS, RVT 

## 2018-03-23 NOTE — Progress Notes (Signed)
Cardiology Consultation:    Date:  03/23/2018   ID:  [name], DOB 1960-07-14, MRN [id number]  PCP:  System, Pcp Not In  Cardiologist:  Jenne Campus, MD   Referring MD: Angelina Sheriff, MD   Chief Complaint  Patient presents with  . Palpitations    History of Present Illness:    [name] is a 58 y.o. female who is being seen today for the evaluation of palpitations at the request of Jacqlyn Larsen II, MD for last few weeks she is being experiencing palpitations which she feels by that she noted that her pause will have skipped beats.  Also in the chest she will feel sometimes some strength sensation.  It happened few weeks ago she never had it before denies have any chest pain tightness squeezing pressure burning chest she does have some exertional shortness of breath but she admits that she is overweight and she contributes this to that problem.  Recently she cut down significantly amount of tea coffee and caffeinated drinks she has been drinking and palpitations improve quite significantly.  But still present.  Does not have any risk factors for coronary artery disease does however have family history of premature coronary artery disease her father actually had myocardial infarction before age of 37.   Past Medical History:  Diagnosis Date  . Depression    MANIAC  . Fibromyalgia   . Insomnia   . Migraine headache   . Overactive bladder     Past Surgical History:  Procedure Laterality Date  . EXPLORATORY LAPAROTOMY LEFT CYSTECTOMY     DERMOID ON LEFT  . LAPAROSCOPIC CHLOECYSTECTOMY  2005  . LEEP CONE    . MOLE REMOVAL     PRECANCEROUS  . [place]  . SUBURETHRAL SLING  11/30/2009   SOLYX    Current Medications: Current Meds  Medication Sig  . famotidine (PEPCID) 20 MG tablet Take 20 mg by mouth daily.  Marland Kitchen omeprazole (PRILOSEC) 20 MG capsule Take 20 mg by mouth daily.     Allergies:   Patient has no known allergies.   Social  History   Socioeconomic History  . Marital status: Married    Spouse name: Not on file  . Number of children: Not on file  . Years of education: Not on file  . Highest education level: Not on file  Occupational History  . Not on file  Social Needs  . Financial resource strain: Not on file  . Food insecurity:    Worry: Not on file    Inability: Not on file  . Transportation needs:    Medical: Not on file    Non-medical: Not on file  Tobacco Use  . Smoking status: Never Smoker  . Smokeless tobacco: Never Used  Substance and Sexual Activity  . Alcohol use: No  . Drug use: Never  . Sexual activity: Yes    Birth control/protection: Pill  Lifestyle  . Physical activity:    Days per week: Not on file    Minutes per session: Not on file  . Stress: Not on file  Relationships  . Social connections:    Talks on phone: Not on file    Gets together: Not on file    Attends religious service: Not on file    Active member of club or organization: Not on file    Attends meetings of clubs or organizations: Not on file    Relationship status: Not on  file  Other Topics Concern  . Not on file  Social History Narrative  . Not on file     Family History: The patient's family history includes Breast cancer in her maternal grandmother; Heart disease in her father. ROS:   Please see the history of present illness.    All 14 point review of systems negative except as described per history of present illness.  EKGs/Labs/Other Studies Reviewed:    The following studies were reviewed today: Normal sinus rhythm normal P interval normal QS complex duration morphology no ST-T segment changes  Recent Labs: No results found for requested labs within last 8760 hours.  Recent Lipid Panel    Component Value Date/Time   CHOL 165 10/05/2011 1513    Physical Exam:    VS:  BP 120/80   Pulse 77   Ht 5\' 6"  (1.676 m)   Wt 235 lb 6.4 oz (106.8 kg)   SpO2 98%   BMI 37.99 kg/m     Wt  Readings from Last 3 Encounters:  03/23/18 235 lb 6.4 oz (106.8 kg)  10/26/12 208 lb (94.3 kg)  10/05/11 183 lb (83 kg)     GEN:  Well nourished, well developed in no acute distress HEENT: Normal NECK: No JVD; No carotid bruits LYMPHATICS: No lymphadenopathy CARDIAC: RRR, no murmurs, no rubs, no gallops RESPIRATORY:  Clear to auscultation without rales, wheezing or rhonchi  ABDOMEN: Soft, non-tender, non-distended MUSCULOSKELETAL:  No edema; No deformity  SKIN: Warm and dry NEUROLOGIC:  Alert and oriented x 3 PSYCHIATRIC:  Normal affect   ASSESSMENT:    1. Weight gain   2. Palpitations   3. Dyspnea on exertion    PLAN:    In order of problems listed above:  1. Palpitations I will ask her to wear monitor for 7 days to see exactly what kind of arrhythmia with dealing with.  I will not initiate any medications until we have some better understanding what the problem seems to be.  As a part of evaluation especially in view of the fact that she does have some exertional shortness of breath I will ask her to have echocardiogram to be done to assess left ventricular ejection fraction. 2. Dyspnea on exertion echocardiogram will be done. 3. Risk factors assessment for coronary artery disease.  I will call primary care physician to get fasting lipid profile.  We also initiated conversation about exercises on the regular basis and proper diet.   Medication Adjustments/Labs and Tests Ordered: Current medicines are reviewed at length with the patient today.  Concerns regarding medicines are outlined above.  No orders of the defined types were placed in this encounter.  No orders of the defined types were placed in this encounter.   Signed, Park Liter, MD, [facility]. 03/23/2018 10:34 AM    [facility]

## 2018-03-23 NOTE — Patient Instructions (Signed)
Medication Instructions:   Your physician recommends that you continue on your current medications as directed. Please refer to the Current Medication list given to you today.  If you need a refill on your cardiac medications before your next appointment, please call your pharmacy.   Lab work:  NONE  If you have labs (blood work) drawn today and your tests are completely normal, you will receive your results only by: Marland Kitchen MyChart Message (if you have MyChart) OR . A paper copy in the mail If you have any lab test that is abnormal or we need to change your treatment, we will call you to review the results.  Testing/Procedures:  Your physician has requested that you have an echocardiogram. Echocardiography is a painless test that uses sound waves to create images of your heart. It provides your doctor with information about the size and shape of your heart and how well your heart's chambers and valves are working. This procedure takes approximately one hour. There are no restrictions for this procedure.  Your physician has recommended that you wear a holter monitor. Holter monitors are medical devices that record the heart's electrical activity. Doctors most often use these monitors to diagnose arrhythmias. Arrhythmias are problems with the speed or rhythm of the heartbeat. The monitor is a small, portable device. You can wear one while you do your normal daily activities. This is usually used to diagnose what is causing palpitations/syncope (passing out).    Follow-Up: At [facility], you and your health needs are our priority.  As part of our continuing mission to provide you with exceptional heart care, we have created designated Provider Care Teams.  These Care Teams include your primary Cardiologist (physician) and Advanced Practice Providers (APPs -  Physician Assistants and Nurse Practitioners) who all work together to provide you with the care you need, when you need it.  You will  need a follow up appointment in 6 weeks.

## 2018-03-26 ENCOUNTER — Ambulatory Visit (INDEPENDENT_AMBULATORY_CARE_PROVIDER_SITE_OTHER): Payer: Federal, State, Local not specified - PPO

## 2018-03-26 DIAGNOSIS — R002 Palpitations: Secondary | ICD-10-CM

## 2018-05-08 ENCOUNTER — Ambulatory Visit: Payer: Federal, State, Local not specified - PPO | Admitting: Cardiology

## 2018-05-18 ENCOUNTER — Encounter: Payer: Self-pay | Admitting: Cardiology

## 2018-05-18 ENCOUNTER — Ambulatory Visit (INDEPENDENT_AMBULATORY_CARE_PROVIDER_SITE_OTHER): Payer: Federal, State, Local not specified - PPO | Admitting: Cardiology

## 2018-05-18 VITALS — BP 138/84 | HR 97 | Ht 66.0 in | Wt 236.0 lb

## 2018-05-18 DIAGNOSIS — F419 Anxiety disorder, unspecified: Secondary | ICD-10-CM

## 2018-05-18 DIAGNOSIS — I493 Ventricular premature depolarization: Secondary | ICD-10-CM | POA: Diagnosis not present

## 2018-05-18 DIAGNOSIS — R002 Palpitations: Secondary | ICD-10-CM | POA: Diagnosis not present

## 2018-05-18 DIAGNOSIS — R0600 Dyspnea, unspecified: Secondary | ICD-10-CM

## 2018-05-18 DIAGNOSIS — R0609 Other forms of dyspnea: Secondary | ICD-10-CM | POA: Diagnosis not present

## 2018-05-18 MED ORDER — METOPROLOL TARTRATE 25 MG PO TABS: 12.5000 mg | ORAL_TABLET | Freq: Two times a day (BID) | ORAL | 6 refills | Status: DC

## 2018-05-18 NOTE — Patient Instructions (Signed)
Medication Instructions:  START taking metoprolol tartrate 12.5 (1 tablet) two times per day   If you need a refill on your cardiac medications before your next appointment, please call your pharmacy.   Lab work: NONE   If you have labs (blood work) drawn today and your tests are completely normal, you will receive your results only by: Marland Kitchen MyChart Message (if you have MyChart) OR . A paper copy in the mail If you have any lab test that is abnormal or we need to change your treatment, we will call you to review the results.  Testing/Procedures: NONE  Follow-Up: At [facility], you and your health needs are our priority.  As part of our continuing mission to provide you with exceptional heart care, we have created designated Provider Care Teams.  These Care Teams include your primary Cardiologist (physician) and Advanced Practice Providers (APPs -  Physician Assistants and Nurse Practitioners) who all work together to provide you with the care you need, when you need it. You will need a follow up appointment in 6 months.    Any Other Special Instructions Will Be Listed Below   Metoprolol tablets What is this medicine? METOPROLOL (me TOE proe lole) is a beta-blocker. Beta-blockers reduce the workload on the heart and help it to beat more regularly. This medicine is used to treat high blood pressure and to prevent chest pain. It is also used to after a heart attack and to prevent an additional heart attack from occurring. This medicine may be used for other purposes; ask your health care provider or pharmacist if you have questions. COMMON BRAND NAME(S): Lopressor What should I tell my health care provider before I take this medicine? They need to know if you have any of these conditions: -diabetes -heart or vessel disease like slow heart rate, worsening heart failure, heart block, sick sinus syndrome or Raynaud's disease -kidney disease -liver disease -lung or breathing disease,  like asthma or emphysema -pheochromocytoma -thyroid disease -an unusual or allergic reaction to metoprolol, other beta-blockers, medicines, foods, dyes, or preservatives -pregnant or trying to get pregnant -breast-feeding How should I use this medicine? Take this medicine by mouth with a drink of water. Follow the directions on the prescription label. Take this medicine immediately after meals. Take your doses at regular intervals. Do not take more medicine than directed. Do not stop taking this medicine suddenly. This could lead to serious heart-related effects. Talk to your pediatrician regarding the use of this medicine in children. Special care may be needed. Overdosage: If you think you have taken too much of this medicine contact a poison control center or emergency room at once. NOTE: This medicine is only for you. Do not share this medicine with others. What if I miss a dose? If you miss a dose, take it as soon as you can. If it is almost time for your next dose, take only that dose. Do not take double or extra doses. What may interact with this medicine? This medicine may interact with the following medications: -certain medicines for blood pressure, heart disease, irregular heart beat -certain medicines for depression like monoamine oxidase (MAO) inhibitors, fluoxetine, or paroxetine -clonidine -dobutamine -epinephrine -isoproterenol -reserpine This list may not describe all possible interactions. Give your health care provider a list of all the medicines, herbs, non-prescription drugs, or dietary supplements you use. Also tell them if you smoke, drink alcohol, or use illegal drugs. Some items may interact with your medicine. What should I watch for  while using this medicine? Visit your doctor or health care professional for regular check ups. Contact your doctor right away if your symptoms worsen. Check your blood pressure and pulse rate regularly. Ask your health care professional  what your blood pressure and pulse rate should be, and when you should contact them. You may get drowsy or dizzy. Do not drive, use machinery, or do anything that needs mental alertness until you know how this medicine affects you. Do not sit or stand up quickly, especially if you are an older patient. This reduces the risk of dizzy or fainting spells. Contact your doctor if these symptoms continue. Alcohol may interfere with the effect of this medicine. Avoid alcoholic drinks. What side effects may I notice from receiving this medicine? Side effects that you should report to your doctor or health care professional as soon as possible: -allergic reactions like skin rash, itching or hives -cold or numb hands or feet -depression -difficulty breathing -faint -fever with sore throat -irregular heartbeat, chest pain -rapid weight gain -swollen legs or ankles Side effects that usually do not require medical attention (report to your doctor or health care professional if they continue or are bothersome): -anxiety or nervousness -change in sex drive or performance -dry skin -headache -nightmares or trouble sleeping -short term memory loss -stomach upset or diarrhea -unusually tired This list may not describe all possible side effects. Call your doctor for medical advice about side effects. You may report side effects to FDA at 1-800-FDA-1088. Where should I keep my medicine? Keep out of the reach of children. Store at room temperature between 15 and 30 degrees C (59 and 86 degrees F). Throw away any unused medicine after the expiration date. NOTE: This sheet is a summary. It may not cover all possible information. If you have questions about this medicine, talk to your doctor, pharmacist, or health care provider.  2019 Elsevier/Gold Standard (2012-11-02 14:40:36)

## 2018-05-18 NOTE — Progress Notes (Signed)
Cardiology Office Note:    Date:  05/18/2018   ID:  [name], DOB 26-Apr-1960, MRN [id number]  PCP:  System, Pcp Not In  Cardiologist:  Jenne Campus, MD    Referring MD: No ref. provider found   Chief Complaint  Patient presents with  . Follow-up  Still have some palpitations  History of Present Illness:    [name] is a 58 y.o. female with PVCs.  Comes today to my office for follow-up overall she is doing well but still described to have some palpitations we discussed in length the benign nature of this palpitations I told her we do not really need to do anything unless if she is symptomatic.  She we eventually decided to give her small dose of beta-blockers will be metoprolol tartrate 12.5 mg twice daily I told her to try to take it make sure she tolerates this okay and then if she wants she can take it on as-needed basis  Past Medical History:  Diagnosis Date  . Depression    MANIAC  . Fibromyalgia   . Insomnia   . Migraine headache   . Overactive bladder     Past Surgical History:  Procedure Laterality Date  . EXPLORATORY LAPAROTOMY LEFT CYSTECTOMY     DERMOID ON LEFT  . LAPAROSCOPIC CHLOECYSTECTOMY  2005  . LEEP CONE    . MOLE REMOVAL     PRECANCEROUS  . [place]  . SUBURETHRAL SLING  11/30/2009   SOLYX    Current Medications: Current Meds  Medication Sig  . famotidine (PEPCID) 20 MG tablet Take 20 mg by mouth daily.  Marland Kitchen omeprazole (PRILOSEC) 20 MG capsule Take 20 mg by mouth daily.     Allergies:   Patient has no known allergies.   Social History   Socioeconomic History  . Marital status: Married    Spouse name: Not on file  . Number of children: Not on file  . Years of education: Not on file  . Highest education level: Not on file  Occupational History  . Not on file  Social Needs  . Financial resource strain: Not on file  . Food insecurity:    Worry: Not on file    Inability: Not on file  . Transportation needs:      Medical: Not on file    Non-medical: Not on file  Tobacco Use  . Smoking status: Never Smoker  . Smokeless tobacco: Never Used  Substance and Sexual Activity  . Alcohol use: No  . Drug use: Never  . Sexual activity: Yes    Birth control/protection: Pill  Lifestyle  . Physical activity:    Days per week: Not on file    Minutes per session: Not on file  . Stress: Not on file  Relationships  . Social connections:    Talks on phone: Not on file    Gets together: Not on file    Attends religious service: Not on file    Active member of club or organization: Not on file    Attends meetings of clubs or organizations: Not on file    Relationship status: Not on file  Other Topics Concern  . Not on file  Social History Narrative  . Not on file     Family History: The patient's family history includes Breast cancer in her maternal grandmother; Heart disease in her father. ROS:   Please see the history of present illness.    All 14 point  review of systems negative except as described per history of present illness  EKGs/Labs/Other Studies Reviewed:      Recent Labs: No results found for requested labs within last 8760 hours.  Recent Lipid Panel    Component Value Date/Time   CHOL 165 10/05/2011 1513    Physical Exam:    VS:  BP 138/84   Pulse 97   Ht 5\' 6"  (1.676 m)   Wt 236 lb (107 kg)   SpO2 97%   BMI 38.09 kg/m     Wt Readings from Last 3 Encounters:  05/18/18 236 lb (107 kg)  03/23/18 235 lb 6.4 oz (106.8 kg)  10/26/12 208 lb (94.3 kg)     GEN:  Well nourished, well developed in no acute distress HEENT: Normal NECK: No JVD; No carotid bruits LYMPHATICS: No lymphadenopathy CARDIAC: RRR, no murmurs, no rubs, no gallops RESPIRATORY:  Clear to auscultation without rales, wheezing or rhonchi  ABDOMEN: Soft, non-tender, non-distended MUSCULOSKELETAL:  No edema; No deformity  SKIN: Warm and dry LOWER EXTREMITIES: no swelling NEUROLOGIC:  Alert and  oriented x 3 PSYCHIATRIC:  Normal affect   ASSESSMENT:    1. Palpitations   2. Premature ventricular beats   3. Dyspnea on exertion   4. Anxiety    PLAN:    In order of problems listed above:  1. Palpitations PVCs plan as outlined above we will try small dose of beta-blocker. 2. Dyspnea on exertion doing well from that point review echocardiogram is normal. 3. Anxiety stable.   Medication Adjustments/Labs and Tests Ordered: Current medicines are reviewed at length with the patient today.  Concerns regarding medicines are outlined above.  No orders of the defined types were placed in this encounter.  Medication changes: No orders of the defined types were placed in this encounter.   Signed, Park Liter, MD, [facility] 05/18/2018 4:02 PM    [facility]

## 2018-11-20 ENCOUNTER — Other Ambulatory Visit: Payer: Self-pay

## 2018-11-20 ENCOUNTER — Ambulatory Visit (INDEPENDENT_AMBULATORY_CARE_PROVIDER_SITE_OTHER): Payer: Federal, State, Local not specified - PPO | Admitting: Cardiology

## 2018-11-20 ENCOUNTER — Encounter: Payer: Self-pay | Admitting: Cardiology

## 2018-11-20 VITALS — BP 120/64 | HR 60 | Resp 10 | Ht 66.0 in | Wt 245.6 lb

## 2018-11-20 DIAGNOSIS — R002 Palpitations: Secondary | ICD-10-CM | POA: Diagnosis not present

## 2018-11-20 DIAGNOSIS — R0609 Other forms of dyspnea: Secondary | ICD-10-CM

## 2018-11-20 DIAGNOSIS — R0600 Dyspnea, unspecified: Secondary | ICD-10-CM

## 2018-11-20 DIAGNOSIS — I493 Ventricular premature depolarization: Secondary | ICD-10-CM | POA: Diagnosis not present

## 2018-11-20 MED ORDER — METOPROLOL SUCCINATE ER 25 MG PO TB24: 25.0000 mg | ORAL_TABLET | Freq: Every day | ORAL | 1 refills | Status: DC

## 2018-11-20 NOTE — Progress Notes (Signed)
Cardiology Office Note:    Date:  11/20/2018   ID:  [name], DOB [birth year], MRN [id number]  PCP:  Angelina Sheriff, MD  Cardiologist:  Jenne Campus, MD    Referring MD: Angelina Sheriff, MD   Chief Complaint  Patient presents with  . Follow-up  Doing fine  History of Present Illness:    [name] is a 58 y.o. female with palpitations, PVCs on the monitor, successfully suppressed with beta-blocker.  Also dyspnea on exertion with normal echocardiogram comes today to my office follow-up will doing well squeezing pressure pain chest palpitations are very rare and she is doing well with it.  Past Medical History:  Diagnosis Date  . Depression    MANIAC  . Fibromyalgia   . Insomnia   . Migraine headache   . Overactive bladder     Past Surgical History:  Procedure Laterality Date  . EXPLORATORY LAPAROTOMY LEFT CYSTECTOMY     DERMOID ON LEFT  . LAPAROSCOPIC CHLOECYSTECTOMY  2005  . LEEP CONE    . MOLE REMOVAL     PRECANCEROUS  . [place]  . SUBURETHRAL SLING  11/30/2009   SOLYX    Current Medications: Current Meds  Medication Sig  . famotidine (PEPCID) 20 MG tablet Take 20 mg by mouth daily.  . metoprolol tartrate (LOPRESSOR) 25 MG tablet Take 0.5 tablets (12.5 mg total) by mouth 2 (two) times daily.  Marland Kitchen omeprazole (PRILOSEC) 20 MG capsule Take 20 mg by mouth daily.     Allergies:   Patient has no known allergies.   Social History   Socioeconomic History  . Marital status: Married    Spouse name: Not on file  . Number of children: Not on file  . Years of education: Not on file  . Highest education level: Not on file  Occupational History  . Not on file  Social Needs  . Financial resource strain: Not on file  . Food insecurity    Worry: Not on file    Inability: Not on file  . Transportation needs    Medical: Not on file    Non-medical: Not on file  Tobacco Use  . Smoking status: Never Smoker  . Smokeless tobacco:  Never Used  Substance and Sexual Activity  . Alcohol use: No  . Drug use: Never  . Sexual activity: Yes    Birth control/protection: Pill  Lifestyle  . Physical activity    Days per week: Not on file    Minutes per session: Not on file  . Stress: Not on file  Relationships  . Social Herbalist on phone: Not on file    Gets together: Not on file    Attends religious service: Not on file    Active member of club or organization: Not on file    Attends meetings of clubs or organizations: Not on file    Relationship status: Not on file  Other Topics Concern  . Not on file  Social History Narrative  . Not on file     Family History: The patient's family history includes Breast cancer in her maternal grandmother; Heart disease in her father. ROS:   Please see the history of present illness.    All 14 point review of systems negative except as described per history of present illness  EKGs/Labs/Other Studies Reviewed:      Recent Labs: No results found for requested labs within last 8760 hours.  Recent Lipid Panel    Component Value Date/Time   CHOL 165 10/05/2011 1513    Physical Exam:    VS:  BP 120/64   Pulse 60   Resp 10   Ht 5\' 6"  (1.676 m)   Wt 245 lb 9.6 oz (111.4 kg)   BMI 39.64 kg/m     Wt Readings from Last 3 Encounters:  11/20/18 245 lb 9.6 oz (111.4 kg)  05/18/18 236 lb (107 kg)  03/23/18 235 lb 6.4 oz (106.8 kg)     GEN:  Well nourished, well developed in no acute distress HEENT: Normal NECK: No JVD; No carotid bruits LYMPHATICS: No lymphadenopathy CARDIAC: RRR, no murmurs, no rubs, no gallops RESPIRATORY:  Clear to auscultation without rales, wheezing or rhonchi  ABDOMEN: Soft, non-tender, non-distended MUSCULOSKELETAL:  No edema; No deformity  SKIN: Warm and dry LOWER EXTREMITIES: no swelling NEUROLOGIC:  Alert and oriented x 3 PSYCHIATRIC:  Normal affect   ASSESSMENT:    1. Premature ventricular beats   2. Palpitations    3. Dyspnea on exertion    PLAN:    In order of problems listed above:  1. Palpitations in the form of premature ventricular beat successfully suppressed with beta-blocker which I will continue we will switch to long-acting form of this medication. 2. Dyspnea on exertion undoubtedly related to her weight gain.  She is trying to work on losing it.  She understands with the problem that she is to drink a lot of sodas but she stopped it she should be able to lose it.   Medication Adjustments/Labs and Tests Ordered: Current medicines are reviewed at length with the patient today.  Concerns regarding medicines are outlined above.  No orders of the defined types were placed in this encounter.  Medication changes: No orders of the defined types were placed in this encounter.   Signed, Park Liter, MD, [facility] 11/20/2018 4:41 PM    [place]

## 2018-11-20 NOTE — Patient Instructions (Signed)
Medication Instructions:  Your physician has recommended you make the following change in your medication:  STOP: Metoprolol tartrate   START: Metoprolol succinate 25 mg daily   If you need a refill on your cardiac medications before your next appointment, please call your pharmacy.   Lab work: None.  If you have labs (blood work) drawn today and your tests are completely normal, you will receive your results only by: Marland Kitchen MyChart Message (if you have MyChart) OR . A paper copy in the mail If you have any lab test that is abnormal or we need to change your treatment, we will call you to review the results.  Testing/Procedures: None   Follow-Up: At [facility], you and your health needs are our priority.  As part of our continuing mission to provide you with exceptional heart care, we have created designated Provider Care Teams.  These Care Teams include your primary Cardiologist (physician) and Advanced Practice Providers (APPs -  Physician Assistants and Nurse Practitioners) who all work together to provide you with the care you need, when you need it. You will need a follow up appointment in 6 months.  Please call our office 2 months in advance to schedule this appointment.  You may see No primary care provider on file. or another member of our [employer] Provider Team in [place]: Shirlee More, MD . Jyl Heinz, MD  Any Other Special Instructions Will Be Listed Below (If Applicable).

## 2018-11-22 DIAGNOSIS — S91309A Unspecified open wound, unspecified foot, initial encounter: Secondary | ICD-10-CM | POA: Diagnosis not present

## 2018-11-22 DIAGNOSIS — Z23 Encounter for immunization: Secondary | ICD-10-CM | POA: Diagnosis not present

## 2018-11-22 DIAGNOSIS — Z6838 Body mass index (BMI) 38.0-38.9, adult: Secondary | ICD-10-CM | POA: Diagnosis not present

## 2019-01-04 ENCOUNTER — Other Ambulatory Visit: Payer: Self-pay | Admitting: Obstetrics and Gynecology

## 2019-01-04 DIAGNOSIS — Z1231 Encounter for screening mammogram for malignant neoplasm of breast: Secondary | ICD-10-CM

## 2019-02-21 ENCOUNTER — Other Ambulatory Visit: Payer: Self-pay

## 2019-02-21 ENCOUNTER — Ambulatory Visit
Admission: RE | Admit: 2019-02-21 | Discharge: 2019-02-21 | Disposition: A | Discharge status: Home / Self Care | Payer: Federal, State, Local not specified - PPO | Source: Ambulatory Visit | Attending: Obstetrics and Gynecology | Admitting: Obstetrics and Gynecology

## 2019-02-21 DIAGNOSIS — Z1231 Encounter for screening mammogram for malignant neoplasm of breast: Secondary | ICD-10-CM | POA: Diagnosis not present

## 2019-02-22 ENCOUNTER — Other Ambulatory Visit: Payer: Self-pay | Admitting: Obstetrics and Gynecology

## 2019-02-22 DIAGNOSIS — R928 Other abnormal and inconclusive findings on diagnostic imaging of breast: Secondary | ICD-10-CM

## 2019-02-28 ENCOUNTER — Other Ambulatory Visit: Payer: Self-pay | Admitting: Family Medicine

## 2019-02-28 DIAGNOSIS — R928 Other abnormal and inconclusive findings on diagnostic imaging of breast: Secondary | ICD-10-CM

## 2019-03-01 ENCOUNTER — Other Ambulatory Visit: Payer: Self-pay

## 2019-03-01 ENCOUNTER — Other Ambulatory Visit: Payer: Self-pay | Admitting: Family Medicine

## 2019-03-01 ENCOUNTER — Ambulatory Visit
Admission: RE | Admit: 2019-03-01 | Discharge: 2019-03-01 | Disposition: A | Discharge status: Home / Self Care | Payer: Federal, State, Local not specified - PPO | Source: Ambulatory Visit | Attending: Obstetrics and Gynecology | Admitting: Obstetrics and Gynecology

## 2019-03-01 DIAGNOSIS — R928 Other abnormal and inconclusive findings on diagnostic imaging of breast: Secondary | ICD-10-CM

## 2019-03-01 DIAGNOSIS — R922 Inconclusive mammogram: Secondary | ICD-10-CM | POA: Diagnosis not present

## 2019-03-01 DIAGNOSIS — N6489 Other specified disorders of breast: Secondary | ICD-10-CM | POA: Diagnosis not present

## 2019-03-11 DIAGNOSIS — Z0100 Encounter for examination of eyes and vision without abnormal findings: Secondary | ICD-10-CM | POA: Diagnosis not present

## 2019-03-12 ENCOUNTER — Other Ambulatory Visit: Payer: Self-pay

## 2019-03-12 ENCOUNTER — Ambulatory Visit
Admission: RE | Admit: 2019-03-12 | Discharge: 2019-03-12 | Disposition: A | Discharge status: Home / Self Care | Payer: Federal, State, Local not specified - PPO | Source: Ambulatory Visit | Attending: Family Medicine | Admitting: Family Medicine

## 2019-03-12 DIAGNOSIS — N6489 Other specified disorders of breast: Secondary | ICD-10-CM | POA: Diagnosis not present

## 2019-03-12 DIAGNOSIS — D0501 Lobular carcinoma in situ of right breast: Secondary | ICD-10-CM | POA: Diagnosis not present

## 2019-03-12 DIAGNOSIS — R928 Other abnormal and inconclusive findings on diagnostic imaging of breast: Secondary | ICD-10-CM | POA: Diagnosis not present

## 2019-03-25 DIAGNOSIS — D0501 Lobular carcinoma in situ of right breast: Secondary | ICD-10-CM | POA: Diagnosis not present

## 2019-03-26 ENCOUNTER — Other Ambulatory Visit: Payer: Self-pay | Admitting: General Surgery

## 2019-03-26 DIAGNOSIS — D0501 Lobular carcinoma in situ of right breast: Secondary | ICD-10-CM

## 2019-03-28 ENCOUNTER — Other Ambulatory Visit: Payer: Self-pay | Admitting: General Surgery

## 2019-03-28 DIAGNOSIS — D0501 Lobular carcinoma in situ of right breast: Secondary | ICD-10-CM

## 2019-04-04 ENCOUNTER — Other Ambulatory Visit: Payer: Self-pay

## 2019-04-04 ENCOUNTER — Encounter (HOSPITAL_BASED_OUTPATIENT_CLINIC_OR_DEPARTMENT_OTHER): Payer: Self-pay | Admitting: General Surgery

## 2019-04-04 DIAGNOSIS — Z01419 Encounter for gynecological examination (general) (routine) without abnormal findings: Secondary | ICD-10-CM | POA: Diagnosis not present

## 2019-04-08 ENCOUNTER — Other Ambulatory Visit (HOSPITAL_COMMUNITY)
Admission: RE | Admit: 2019-04-08 | Discharge: 2019-04-08 | Disposition: A | Discharge status: Home / Self Care | Payer: Federal, State, Local not specified - PPO | Source: Ambulatory Visit | Attending: General Surgery | Admitting: General Surgery

## 2019-04-08 ENCOUNTER — Encounter (HOSPITAL_BASED_OUTPATIENT_CLINIC_OR_DEPARTMENT_OTHER)
Admission: RE | Admit: 2019-04-08 | Discharge: 2019-04-08 | Disposition: A | Discharge status: Home / Self Care | Payer: Federal, State, Local not specified - PPO | Source: Ambulatory Visit | Attending: General Surgery | Admitting: General Surgery

## 2019-04-08 ENCOUNTER — Other Ambulatory Visit: Payer: Self-pay

## 2019-04-08 DIAGNOSIS — I493 Ventricular premature depolarization: Secondary | ICD-10-CM | POA: Diagnosis not present

## 2019-04-08 DIAGNOSIS — Z01818 Encounter for other preprocedural examination: Secondary | ICD-10-CM | POA: Diagnosis not present

## 2019-04-08 DIAGNOSIS — Z79899 Other long term (current) drug therapy: Secondary | ICD-10-CM | POA: Diagnosis not present

## 2019-04-08 DIAGNOSIS — Z20822 Contact with and (suspected) exposure to covid-19: Secondary | ICD-10-CM | POA: Diagnosis not present

## 2019-04-08 LAB — SARS CORONAVIRUS 2 (TAT 6-24 HRS): SARS Coronavirus 2: NEGATIVE

## 2019-04-08 LAB — POCT PREGNANCY, URINE: Preg Test, Ur: NEGATIVE

## 2019-04-08 MED ORDER — ENSURE PRE-SURGERY PO LIQD: 296.0000 mL | Freq: Once | ORAL | Status: DC

## 2019-04-08 NOTE — Progress Notes (Signed)

## 2019-04-10 ENCOUNTER — Ambulatory Visit
Admission: RE | Admit: 2019-04-10 | Discharge: 2019-04-10 | Disposition: A | Discharge status: Home / Self Care | Payer: Federal, State, Local not specified - PPO | Source: Ambulatory Visit | Attending: General Surgery | Admitting: General Surgery

## 2019-04-10 ENCOUNTER — Other Ambulatory Visit: Payer: Self-pay

## 2019-04-10 DIAGNOSIS — D0501 Lobular carcinoma in situ of right breast: Secondary | ICD-10-CM | POA: Diagnosis not present

## 2019-04-10 NOTE — Anesthesia Preprocedure Evaluation (Addendum)
Anesthesia Evaluation  Patient identified by MRN, date of birth, ID band Patient awake    Reviewed: Allergy & Precautions, NPO status , Patient's Chart, lab work & pertinent test results  Airway Mallampati: II  TM Distance: >3 FB Neck ROM: Full    Dental no notable dental hx. (+) Implants, Teeth Intact   Pulmonary neg pulmonary ROS,    Pulmonary exam normal breath sounds clear to auscultation       Cardiovascular Exercise Tolerance: Good Normal cardiovascular exam Rhythm:Regular Rate:Normal     Neuro/Psych  Headaches, negative psych ROS   GI/Hepatic negative GI ROS, Neg liver ROS,   Endo/Other  negative endocrine ROS  Renal/GU      Musculoskeletal  (+) Fibromyalgia -  Abdominal (+) + obese,   Peds  Hematology negative hematology ROS (+)   Anesthesia Other Findings   Reproductive/Obstetrics                            Anesthesia Physical Anesthesia Plan  ASA: III  Anesthesia Plan: General   Post-op Pain Management:    Induction: Intravenous  PONV Risk Score and Plan: 4 or greater and Ondansetron, Dexamethasone and Midazolam  Airway Management Planned: LMA  Additional Equipment: None  Intra-op Plan:   Post-operative Plan:   Informed Consent: I have reviewed the patients History and Physical, chart, labs and discussed the procedure including the risks, benefits and alternatives for the proposed anesthesia with the patient or authorized representative who has indicated his/her understanding and acceptance.     Dental advisory given  Plan Discussed with: CRNA  Anesthesia Plan Comments:        Anesthesia Quick Evaluation

## 2019-04-11 ENCOUNTER — Encounter (HOSPITAL_BASED_OUTPATIENT_CLINIC_OR_DEPARTMENT_OTHER)
Admission: RE | Disposition: A | Discharge status: Home / Self Care | Payer: Self-pay | Source: Home / Self Care | Attending: General Surgery

## 2019-04-11 ENCOUNTER — Ambulatory Visit
Admission: RE | Admit: 2019-04-11 | Discharge: 2019-04-11 | Disposition: A | Discharge status: Home / Self Care | Payer: Federal, State, Local not specified - PPO | Source: Ambulatory Visit | Attending: General Surgery | Admitting: General Surgery

## 2019-04-11 ENCOUNTER — Ambulatory Visit (HOSPITAL_BASED_OUTPATIENT_CLINIC_OR_DEPARTMENT_OTHER): Payer: Federal, State, Local not specified - PPO | Admitting: Anesthesiology

## 2019-04-11 ENCOUNTER — Ambulatory Visit (HOSPITAL_BASED_OUTPATIENT_CLINIC_OR_DEPARTMENT_OTHER)
Admission: RE | Admit: 2019-04-11 | Discharge: 2019-04-11 | Disposition: A | Discharge status: Home / Self Care | Payer: Federal, State, Local not specified - PPO | Attending: General Surgery | Admitting: General Surgery

## 2019-04-11 ENCOUNTER — Encounter (HOSPITAL_BASED_OUTPATIENT_CLINIC_OR_DEPARTMENT_OTHER): Payer: Self-pay | Admitting: General Surgery

## 2019-04-11 ENCOUNTER — Other Ambulatory Visit: Payer: Self-pay

## 2019-04-11 DIAGNOSIS — F418 Other specified anxiety disorders: Secondary | ICD-10-CM | POA: Diagnosis not present

## 2019-04-11 DIAGNOSIS — D0511 Intraductal carcinoma in situ of right breast: Secondary | ICD-10-CM | POA: Diagnosis not present

## 2019-04-11 DIAGNOSIS — Z79899 Other long term (current) drug therapy: Secondary | ICD-10-CM | POA: Insufficient documentation

## 2019-04-11 DIAGNOSIS — N6489 Other specified disorders of breast: Secondary | ICD-10-CM | POA: Diagnosis not present

## 2019-04-11 DIAGNOSIS — K219 Gastro-esophageal reflux disease without esophagitis: Secondary | ICD-10-CM | POA: Insufficient documentation

## 2019-04-11 DIAGNOSIS — D0501 Lobular carcinoma in situ of right breast: Secondary | ICD-10-CM | POA: Diagnosis not present

## 2019-04-11 DIAGNOSIS — N6011 Diffuse cystic mastopathy of right breast: Secondary | ICD-10-CM | POA: Diagnosis not present

## 2019-04-11 DIAGNOSIS — M797 Fibromyalgia: Secondary | ICD-10-CM | POA: Diagnosis not present

## 2019-04-11 DIAGNOSIS — G4700 Insomnia, unspecified: Secondary | ICD-10-CM | POA: Diagnosis not present

## 2019-04-11 HISTORY — PX: RADIOACTIVE SEED GUIDED EXCISIONAL BREAST BIOPSY: SHX6490

## 2019-04-11 HISTORY — PX: BREAST EXCISIONAL BIOPSY: SUR124

## 2019-04-11 SURGERY — RADIOACTIVE SEED GUIDED BREAST BIOPSY
Anesthesia: General | Site: Breast | Laterality: Right

## 2019-04-11 MED ORDER — DEXAMETHASONE SODIUM PHOSPHATE 4 MG/ML IJ SOLN
INTRAMUSCULAR | Status: DC | PRN
  Administered 2019-04-11: 5 mg via INTRAVENOUS

## 2019-04-11 MED ORDER — GABAPENTIN 100 MG PO CAPS
ORAL_CAPSULE | ORAL | Status: AC
  Filled 2019-04-11: qty 1

## 2019-04-11 MED ORDER — OXYCODONE HCL 5 MG/5ML PO SOLN: 5.0000 mg | Freq: Once | ORAL | Status: DC | PRN

## 2019-04-11 MED ORDER — FENTANYL CITRATE (PF) 100 MCG/2ML IJ SOLN
INTRAMUSCULAR | Status: AC
  Filled 2019-04-11: qty 2

## 2019-04-11 MED ORDER — MIDAZOLAM HCL 2 MG/2ML IJ SOLN
INTRAMUSCULAR | Status: AC
  Filled 2019-04-11: qty 2

## 2019-04-11 MED ORDER — BUPIVACAINE HCL (PF) 0.25 % IJ SOLN
INTRAMUSCULAR | Status: DC | PRN
  Administered 2019-04-11: 10 mL

## 2019-04-11 MED ORDER — ONDANSETRON HCL 4 MG/2ML IJ SOLN
INTRAMUSCULAR | Status: AC
  Filled 2019-04-11: qty 2

## 2019-04-11 MED ORDER — CEFAZOLIN SODIUM-DEXTROSE 2-4 GM/100ML-% IV SOLN
INTRAVENOUS | Status: AC
  Filled 2019-04-11: qty 100

## 2019-04-11 MED ORDER — LACTATED RINGERS IV SOLN: INTRAVENOUS | Status: DC

## 2019-04-11 MED ORDER — FENTANYL CITRATE (PF) 100 MCG/2ML IJ SOLN
50.0000 ug | INTRAMUSCULAR | Status: DC | PRN
  Administered 2019-04-11 (×2): 50 ug via INTRAVENOUS

## 2019-04-11 MED ORDER — HYDROMORPHONE HCL 1 MG/ML IJ SOLN
INTRAMUSCULAR | Status: AC
  Filled 2019-04-11: qty 0.5

## 2019-04-11 MED ORDER — KETOROLAC TROMETHAMINE 30 MG/ML IJ SOLN: 30.0000 mg | Freq: Once | INTRAMUSCULAR | Status: DC | PRN

## 2019-04-11 MED ORDER — ONDANSETRON HCL 4 MG/2ML IJ SOLN
INTRAMUSCULAR | Status: DC | PRN
  Administered 2019-04-11: 4 mg via INTRAVENOUS

## 2019-04-11 MED ORDER — HYDROMORPHONE HCL 1 MG/ML IJ SOLN
0.2500 mg | INTRAMUSCULAR | Status: DC | PRN
  Administered 2019-04-11 (×3): 0.5 mg via INTRAVENOUS

## 2019-04-11 MED ORDER — PROPOFOL 10 MG/ML IV BOLUS
INTRAVENOUS | Status: DC | PRN
  Administered 2019-04-11: 180 mg via INTRAVENOUS

## 2019-04-11 MED ORDER — LIDOCAINE 2% (20 MG/ML) 5 ML SYRINGE
INTRAMUSCULAR | Status: AC
  Filled 2019-04-11: qty 5

## 2019-04-11 MED ORDER — KETOROLAC TROMETHAMINE 15 MG/ML IJ SOLN
15.0000 mg | INTRAMUSCULAR | Status: AC
  Administered 2019-04-11: 15 mg via INTRAVENOUS

## 2019-04-11 MED ORDER — KETOROLAC TROMETHAMINE 15 MG/ML IJ SOLN
INTRAMUSCULAR | Status: AC
  Filled 2019-04-11: qty 1

## 2019-04-11 MED ORDER — ACETAMINOPHEN 500 MG PO TABS
ORAL_TABLET | ORAL | Status: AC
  Filled 2019-04-11: qty 2

## 2019-04-11 MED ORDER — MIDAZOLAM HCL 2 MG/2ML IJ SOLN
1.0000 mg | INTRAMUSCULAR | Status: DC | PRN
  Administered 2019-04-11: 2 mg via INTRAVENOUS

## 2019-04-11 MED ORDER — GABAPENTIN 100 MG PO CAPS
100.0000 mg | ORAL_CAPSULE | ORAL | Status: AC
  Administered 2019-04-11: 100 mg via ORAL

## 2019-04-11 MED ORDER — OXYCODONE HCL 5 MG PO TABS: 5.0000 mg | ORAL_TABLET | Freq: Once | ORAL | Status: DC | PRN

## 2019-04-11 MED ORDER — ACETAMINOPHEN 500 MG PO TABS
1000.0000 mg | ORAL_TABLET | ORAL | Status: AC
  Administered 2019-04-11: 08:00:00 1000 mg via ORAL

## 2019-04-11 MED ORDER — LIDOCAINE 2% (20 MG/ML) 5 ML SYRINGE
INTRAMUSCULAR | Status: DC | PRN
  Administered 2019-04-11: 60 mg via INTRAVENOUS

## 2019-04-11 MED ORDER — CEFAZOLIN SODIUM-DEXTROSE 2-4 GM/100ML-% IV SOLN
2.0000 g | INTRAVENOUS | Status: AC
  Administered 2019-04-11: 2 g via INTRAVENOUS

## 2019-04-11 MED ORDER — TRAMADOL HCL 50 MG PO TABS: 50.0000 mg | ORAL_TABLET | Freq: Four times a day (QID) | ORAL | 0 refills | Status: DC | PRN

## 2019-04-11 MED ORDER — ONDANSETRON HCL 4 MG/2ML IJ SOLN
4.0000 mg | Freq: Once | INTRAMUSCULAR | Status: AC | PRN
  Administered 2019-04-11: 4 mg via INTRAVENOUS

## 2019-04-11 SURGICAL SUPPLY — 57 items
ADH SKN CLS APL DERMABOND .7 (GAUZE/BANDAGES/DRESSINGS) ×1
APL PRP STRL LF DISP 70% ISPRP (MISCELLANEOUS) ×1
APPLIER CLIP 9.375 MED OPEN (MISCELLANEOUS)
APR CLP MED 9.3 20 MLT OPN (MISCELLANEOUS)
BINDER BREAST LRG (GAUZE/BANDAGES/DRESSINGS) IMPLANT
BINDER BREAST MEDIUM (GAUZE/BANDAGES/DRESSINGS) IMPLANT
BINDER BREAST XLRG (GAUZE/BANDAGES/DRESSINGS) ×1 IMPLANT
BINDER BREAST XXLRG (GAUZE/BANDAGES/DRESSINGS) IMPLANT
BLADE SURG 15 STRL LF DISP TIS (BLADE) ×1 IMPLANT
BLADE SURG 15 STRL SS (BLADE) ×2
CANISTER SUC SOCK COL 7IN (MISCELLANEOUS) IMPLANT
CANISTER SUCT 1200ML W/VALVE (MISCELLANEOUS) IMPLANT
CHLORAPREP W/TINT 26 (MISCELLANEOUS) ×2 IMPLANT
CLIP APPLIE 9.375 MED OPEN (MISCELLANEOUS) IMPLANT
CLIP VESOCCLUDE SM WIDE 6/CT (CLIP) IMPLANT
COVER BACK TABLE 60X90IN (DRAPES) ×2 IMPLANT
COVER MAYO STAND STRL (DRAPES) ×2 IMPLANT
COVER PROBE W GEL 5X96 (DRAPES) ×2 IMPLANT
COVER WAND RF STERILE (DRAPES) IMPLANT
DECANTER SPIKE VIAL GLASS SM (MISCELLANEOUS) IMPLANT
DERMABOND ADVANCED (GAUZE/BANDAGES/DRESSINGS) ×1
DERMABOND ADVANCED .7 DNX12 (GAUZE/BANDAGES/DRESSINGS) ×1 IMPLANT
DRAPE LAPAROSCOPIC ABDOMINAL (DRAPES) ×2 IMPLANT
DRAPE UTILITY XL STRL (DRAPES) ×2 IMPLANT
DRSG TEGADERM 4X4.75 (GAUZE/BANDAGES/DRESSINGS) IMPLANT
ELECT COATED BLADE 2.86 ST (ELECTRODE) ×2 IMPLANT
ELECT REM PT RETURN 9FT ADLT (ELECTROSURGICAL) ×2
ELECTRODE REM PT RTRN 9FT ADLT (ELECTROSURGICAL) ×1 IMPLANT
GAUZE SPONGE 4X4 12PLY STRL LF (GAUZE/BANDAGES/DRESSINGS) IMPLANT
GLOVE BIO SURGEON STRL SZ7 (GLOVE) ×4 IMPLANT
GLOVE BIOGEL PI IND STRL 7.5 (GLOVE) ×1 IMPLANT
GLOVE BIOGEL PI INDICATOR 7.5 (GLOVE) ×1
GOWN STRL REUS W/ TWL LRG LVL3 (GOWN DISPOSABLE) ×2 IMPLANT
GOWN STRL REUS W/TWL LRG LVL3 (GOWN DISPOSABLE) ×4
HEMOSTAT ARISTA ABSORB 3G PWDR (HEMOSTASIS) IMPLANT
KIT MARKER MARGIN INK (KITS) ×2 IMPLANT
NDL HYPO 25X1 1.5 SAFETY (NEEDLE) ×1 IMPLANT
NEEDLE HYPO 25X1 1.5 SAFETY (NEEDLE) ×2 IMPLANT
NS IRRIG 1000ML POUR BTL (IV SOLUTION) IMPLANT
PACK BASIN DAY SURGERY FS (CUSTOM PROCEDURE TRAY) ×2 IMPLANT
PENCIL SMOKE EVACUATOR (MISCELLANEOUS) ×2 IMPLANT
RETRACTOR ONETRAX LX 90X20 (MISCELLANEOUS) IMPLANT
SLEEVE SCD COMPRESS KNEE MED (MISCELLANEOUS) ×2 IMPLANT
SPONGE LAP 4X18 RFD (DISPOSABLE) ×2 IMPLANT
STRIP CLOSURE SKIN 1/2X4 (GAUZE/BANDAGES/DRESSINGS) ×2 IMPLANT
SUT MNCRL AB 4-0 PS2 18 (SUTURE) IMPLANT
SUT MON AB 5-0 PS2 18 (SUTURE) IMPLANT
SUT SILK 2 0 SH (SUTURE) IMPLANT
SUT VIC AB 2-0 SH 27 (SUTURE) ×2
SUT VIC AB 2-0 SH 27XBRD (SUTURE) ×1 IMPLANT
SUT VIC AB 3-0 SH 27 (SUTURE) ×2
SUT VIC AB 3-0 SH 27X BRD (SUTURE) ×1 IMPLANT
SYR CONTROL 10ML LL (SYRINGE) ×2 IMPLANT
TOWEL GREEN STERILE FF (TOWEL DISPOSABLE) ×2 IMPLANT
TRAY FAXITRON CT DISP (TRAY / TRAY PROCEDURE) ×2 IMPLANT
TUBE CONNECTING 20X1/4 (TUBING) IMPLANT
YANKAUER SUCT BULB TIP NO VENT (SUCTIONS) IMPLANT

## 2019-04-11 NOTE — Op Note (Signed)
Preoperative diagnosis:right breast mass with lcis on core biopsy Postoperative diagnosis: Same as above Procedure:Rightbreastseedguided excisional biopsy Surgeon: Dr. Serita Grammes Anesthesia: General  Specimens:Rightbreast tissue marked with paint containing clip and seed Estimated blood [id number] cc Complications: None Drains: None Sponge and count was correct at completion Disposition to recovery in stable condition  Indications:58 yof referred by Dr Rosana Hoes for right breast lcis. she has no prior breast history and no family history. she has no mass or dc. she had screening mm that shows c density breasts. left negative. the right had a possible asymmetry that ends up being negative on dx views. there is also right sided distortion with question of central mass. there is no [place] correlate. there is no ax lad on [place]. she had biopsy and this is LCIS. she was recommended excision and referred  Procedure: After informed consent was obtained shewas placed under general anesthesia without complication.She was given antibiotics. SCDs were in place. She was then prepped and draped in the standard sterile surgical fashion. A surgical timeout was then performed.  I then identified the seed in the upper inner right breast. Iinfiltrated Marcaine. I made a periareolar incision in order to hide the scar later. I then removed the seedand the surrounding tissue..  Clip and seed were  present on specimen mammogram. I mobilized the breast tissue and closed this with 2-0 Vicryl, 3-0 Vicryl, and5-0 Monocryl.glue and steristrips were applied. She had a binder placed. She was extubated and transferred to recovery stable

## 2019-04-11 NOTE — H&P (Signed)
72 yof referred by Dr Rosana Hoes for right breast lcis. she has no prior breast history and no family history. she has no mass or dc. she had screening mm that shows c density breasts. left negative. the right had a possible asymmetry that ends up being negative on dx views. there is also right sided distortion with question of central mass. there is no [place] correlate. there is no ax lad on [place]. she had biopsy and this is LCIS. she was recommended excision and referred  Past Surgical History ([place], [place]; 03/25/2019 4:27 PM) Breast Biopsy  Right. Gallbladder Surgery - Laparoscopic  Hemorrhoidectomy   Diagnostic Studies History (Chanel Teressa Senter, CMA; 03/25/2019 4:27 PM) Colonoscopy  5-10 years ago Mammogram  within last year Pap Smear  1-5 years ago  Allergies ([place], CMA; 03/25/2019 4:27 PM) No Known Allergies [03/25/2019]: No Known Drug Allergies [03/25/2019]: Allergies Reconciled   Medication History (Chanel Teressa Senter, CMA; 03/25/2019 4:27 PM) Famotidine (40MG  Tablet, Oral) Active. Metoprolol Succinate ER (25MG  Tablet ER 24HR, Oral) Active. Omeprazole (20MG  Capsule DR, Oral) Active. Medications Reconciled  Pregnancy / Birth History Antonietta Jewel, [place]; 03/25/2019 4:27 PM) Irregular periods   Other Problems (Chanel Teressa Senter, CMA; 03/25/2019 4:27 PM) Anxiety Disorder  Depression  Gastroesophageal Reflux Disease  Hemorrhoids  Migraine Headache    Review of Systems (Chanel Nolan CMA; 03/25/2019 4:27 PM) General Not Present- Appetite Loss, Chills, Fatigue, Fever, Night Sweats, Weight Gain and Weight Loss. Skin Not Present- Change in Wart/Mole, Dryness, Hives, Jaundice, New Lesions, Non-Healing Wounds, Rash and Ulcer. HEENT Present- Wears glasses/contact lenses. Not Present- Earache, Hearing Loss, Hoarseness, Nose Bleed, Oral Ulcers, Ringing in the Ears, Seasonal Allergies, Sinus Pain, Sore Throat, Visual Disturbances and Yellow Eyes. Respiratory Present- Wheezing. Not  Present- Bloody sputum, Chronic Cough, Difficulty Breathing and Snoring. Breast Not Present- Breast Mass, Breast Pain, Nipple Discharge and Skin Changes. Cardiovascular Not Present- Chest Pain, Difficulty Breathing Lying Down, Leg Cramps, Palpitations, Rapid Heart Rate, Shortness of Breath and Swelling of Extremities. Gastrointestinal Not Present- Abdominal Pain, Bloating, Bloody Stool, Change in Bowel Habits, Chronic diarrhea, Constipation, Difficulty Swallowing, Excessive gas, Gets full quickly at meals, Hemorrhoids, Indigestion, Nausea, Rectal Pain and Vomiting. Female Genitourinary Not Present- Frequency, Nocturia, Painful Urination, Pelvic Pain and Urgency. Neurological Not Present- Decreased Memory, Fainting, Headaches, Numbness, Seizures, Tingling, Tremor, Trouble walking and Weakness. Psychiatric Not Present- Anxiety, Bipolar, Change in Sleep Pattern, Depression, Fearful and Frequent crying. Endocrine Not Present- Cold Intolerance, Excessive Hunger, Hair Changes, Heat Intolerance, Hot flashes and New Diabetes. Hematology Not Present- Blood Thinners, Easy Bruising, Excessive bleeding, Gland problems, HIV and Persistent Infections.  Vitals (Chanel Nolan CMA; 03/25/2019 4:27 PM) 03/25/2019 4:27 PM Weight: 246 lb Height: 66in Body Surface Area: 2.18 m Body Mass Index: 39.71 kg/m  Temp.: 97.40F  Pulse: 89 (Regular)  Physical Exam Rolm Bookbinder MD; 03/26/2019 10:32 AM) General Mental Status-Alert. Orientation-Oriented X3. Head and Neck Trachea-midline. Eye Sclera/Conjunctiva - Bilateral-No scleral icterus. Chest and Lung Exam Chest and lung exam reveals -quiet, even and easy respiratory effort with no use of accessory muscles. Breast Nipples-No Discharge. Breast Lump-No Palpable Breast Mass. Cardiovascular Cardiovascular examination reveals -normal heart sounds, regular rate and rhythm with no murmurs. Neurologic Neurologic evaluation reveals -alert  and oriented x 3 with no impairment of recent or remote memory. Lymphatic Head & Neck General Head & Neck Lymphatics: Bilateral - Description - Normal. Axillary General Axillary Region: Bilateral - Description - Normal. 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 I discussed for lcis routine is now to follow these with mm. there is concern for central mass however and I think reasonable to consider either observation vs excision. observation would run small risk of missing something and she would like to consider excision. we discussed excision with seed guidance, risks and recovery. will proceed soon

## 2019-04-11 NOTE — Transfer of Care (Signed)
Immediate Anesthesia Transfer of Care Note  Patient: [name]  Procedure(s) Performed: RADIOACTIVE SEED GUIDED EXCISIONAL RIGHT BREAST BIOPSY (Right Breast)  Patient Location: PACU  Anesthesia Type:General  Level of Consciousness: awake  Airway & Oxygen Therapy: Patient Spontanous Breathing and Patient connected to nasal cannula oxygen  Post-op Assessment: Report given to RN and Post -op Vital signs reviewed and stable  Post vital signs: Reviewed and stable  Last Vitals:  Vitals Value Taken Time  BP    Temp    Pulse 85 04/11/19 1005  Resp 14 04/11/19 1005  SpO2 100 % 04/11/19 1005  Vitals shown include unvalidated device data.  Last Pain:  Vitals:   04/11/19 0728  TempSrc: Tympanic  PainSc: 0-No pain         Complications: No apparent anesthesia complications

## 2019-04-11 NOTE — Anesthesia Postprocedure Evaluation (Signed)
Anesthesia Post Note  Patient: [name]  Procedure(s) Performed: RADIOACTIVE SEED GUIDED EXCISIONAL RIGHT BREAST BIOPSY (Right Breast)     Patient location during evaluation: PACU Anesthesia Type: General Level of consciousness: awake and alert Pain management: pain level controlled Vital Signs Assessment: post-procedure vital signs reviewed and stable Respiratory status: spontaneous breathing, nonlabored ventilation, respiratory function stable and patient connected to nasal cannula oxygen Cardiovascular status: blood pressure returned to baseline and stable Postop Assessment: no apparent nausea or vomiting Anesthetic complications: no    Last Vitals:  Vitals:   04/11/19 1007 04/11/19 1015  BP:  111/75  Pulse:  76  Resp:  13  Temp: 36.5 C   SpO2:  100%    Last Pain:  Vitals:   04/11/19 1007  TempSrc:   PainSc: 6                  Barnet Glasgow

## 2019-04-11 NOTE — Anesthesia Procedure Notes (Signed)
Procedure Name: LMA Insertion Date/Time: 04/11/2019 9:19 AM Performed by: Lieutenant Diego, CRNA Pre-anesthesia Checklist: Patient identified, Emergency Drugs available, Suction available and Patient being monitored Patient Re-evaluated:Patient Re-evaluated prior to induction Oxygen Delivery Method: Circle system utilized Preoxygenation: Pre-oxygenation with 100% oxygen Induction Type: IV induction Ventilation: Mask ventilation without difficulty LMA: LMA inserted LMA Size: 4.0 Number of attempts: 1 Placement Confirmation: positive ETCO2 and breath sounds checked- equal and bilateral Tube secured with: Tape Dental Injury: Teeth and Oropharynx as per pre-operative assessment

## 2019-04-11 NOTE — Discharge Instructions (Signed)
[facility] Office Phone Number [phone]  BREAST BIOPSY/ PARTIAL MASTECTOMY: POST OP INSTRUCTIONS Take 400 mg of ibuprofen every 8 hours or 650 mg tylenol every 6 hours for next 72 hours then as needed. Use ice several times daily also. Always review your discharge instruction sheet given to you by the facility where your surgery was performed.  IF YOU HAVE DISABILITY OR FAMILY LEAVE FORMS, YOU MUST BRING THEM TO THE OFFICE FOR PROCESSING.  DO NOT GIVE THEM TO YOUR DOCTOR.  1. A prescription for pain medication may be given to you upon discharge.  Take your pain medication as prescribed, if needed.  If narcotic pain medicine is not needed, then you may take acetaminophen (Tylenol), naprosyn (Alleve) or ibuprofen (Advil) as needed. 2. Take your usually prescribed medications unless otherwise directed 3. If you need a refill on your pain medication, please contact your pharmacy.  They will contact our office to request authorization.  Prescriptions will not be filled after 5pm or on week-ends. 4. You should eat very light the first 24 hours after surgery, such as soup, crackers, pudding, etc.  Resume your normal diet the day after surgery. 5. Most patients will experience some swelling and bruising in the breast.  Ice packs and a good support bra will help.  Wear the breast binder provided or a sports bra for 72 hours day and night.  After that wear a sports bra during the day until you return to the office. Swelling and bruising can take several days to resolve.  6. It is common to experience some constipation if taking pain medication after surgery.  Increasing fluid intake and taking a stool softener will usually help or prevent this problem from occurring.  A mild laxative (Milk of Magnesia or Miralax) should be taken according to package directions if there are no bowel movements after 48 hours. 7. Unless discharge instructions indicate otherwise, you may remove your bandages 48  hours after surgery and you may shower at that time.  You may have steri-strips (small skin tapes) in place directly over the incision.  These strips should be left on the skin for 7-10 days and will come off on their own.  If your surgeon used skin glue on the incision, you may shower in 24 hours.  The glue will flake off over the next 2-3 weeks.  Any sutures or staples will be removed at the office during your follow-up visit. 8. ACTIVITIES:  You may resume regular daily activities (gradually increasing) beginning the next day.  Wearing a good support bra or sports bra minimizes pain and swelling.  You may have sexual intercourse when it is comfortable. a. You may drive when you no longer are taking prescription pain medication, you can comfortably wear a seatbelt, and you can safely maneuver your car and apply brakes. b. RETURN TO WORK:  ______________________________________________________________________________________ 9. You should see your doctor in the office for a follow-up appointment approximately two weeks after your surgery.  Your doctor's nurse will typically make your follow-up appointment when she calls you with your pathology report.  Expect your pathology report 3-4 business days after your surgery.  You may call to check if you do not hear from us after three days. 10. OTHER INSTRUCTIONS: _______________________________________________________________________________________________ _____________________________________________________________________________________________________________________________________ _____________________________________________________________________________________________________________________________________ _____________________________________________________________________________________________________________________________________  WHEN TO CALL DR WAKEFIELD: 1. Fever over 101.0 2. Nausea and/or vomiting. 3. Extreme swelling or  bruising. 4. Continued bleeding from incision. 5. Increased pain, redness, or drainage from the incision.  The clinic   staff is available to answer your questions during regular business hours.  Please don't hesitate to call and ask to speak to one of the nurses for clinical concerns.  If you have a medical emergency, go to the nearest emergency room or call 911.  A surgeon from [facility] is always on call at the hospital.  For further questions, please visit [email or web address]      Post Anesthesia Home Care Instructions  Activity: Get plenty of rest for the remainder of the day. A responsible individual must stay with you for 24 hours following the procedure.  For the next 24 hours, DO NOT: -Drive a car -Paediatric nurse -Drink alcoholic beverages -Take any medication unless instructed by your physician -Make any legal decisions or sign important papers.  Meals: Start with liquid foods such as gelatin or soup. Progress to regular foods as tolerated. Avoid greasy, spicy, heavy foods. If nausea and/or vomiting occur, drink only clear liquids until the nausea and/or vomiting subsides. Call your physician if vomiting continues.  Special Instructions/Symptoms: Your throat may feel dry or sore from the anesthesia or the breathing tube placed in your throat during surgery. If this causes discomfort, gargle with warm salt water. The discomfort should disappear within 24 hours.  If you had a scopolamine patch placed behind your ear for the management of post- operative nausea and/or vomiting:  1. The medication in the patch is effective for 72 hours, after which it should be removed.  Wrap patch in a tissue and discard in the trash. Wash hands thoroughly with soap and water. 2. You may remove the patch earlier than 72 hours if you experience unpleasant side effects which may include dry mouth, dizziness or visual disturbances. 3. Avoid touching the patch. Wash your  hands with soap and water after contact with the patch.      Call your surgeon if you experience:   1.  Fever over 101.0. 2.  Inability to urinate. 3.  Nausea and/or vomiting. 4.  Extreme swelling or bruising at the surgical site. 5.  Continued bleeding from the incision. 6.  Increased pain, redness or drainage from the incision. 7.  Problems related to your pain medication. 8.  Any problems and/or concerns    No Ibprofen until at least 2pm today

## 2019-04-11 NOTE — Interval H&P Note (Signed)
History and Physical Interval Note:  04/11/2019 8:32 AM  [name]  has presented today for surgery, with the diagnosis of RIGHT BREAST ABNORMAL MAMMOGRAM.  The various methods of treatment have been discussed with the patient and family. After consideration of risks, benefits and other options for treatment, the patient has consented to  Procedure(s): RADIOACTIVE SEED GUIDED EXCISIONAL RIGHT BREAST BIOPSY (Right) as a surgical intervention.  The patient's history has been reviewed, patient examined, no change in status, stable for surgery.  I have reviewed the patient's chart and labs.  Questions were answered to the patient's satisfaction.     Rolm Bookbinder

## 2019-04-12 ENCOUNTER — Encounter: Payer: Self-pay | Admitting: *Deleted

## 2019-04-12 LAB — SURGICAL PATHOLOGY

## 2019-04-15 NOTE — Addendum Note (Signed)
Addendum  created 04/15/19 [id number] by Tawni Millers, CRNA   Charge Capture section accepted

## 2019-04-25 DIAGNOSIS — L821 Other seborrheic keratosis: Secondary | ICD-10-CM | POA: Diagnosis not present

## 2019-04-25 DIAGNOSIS — D1801 Hemangioma of skin and subcutaneous tissue: Secondary | ICD-10-CM | POA: Diagnosis not present

## 2019-05-08 DIAGNOSIS — M2011 Hallux valgus (acquired), right foot: Secondary | ICD-10-CM | POA: Diagnosis not present

## 2019-05-08 DIAGNOSIS — Z01818 Encounter for other preprocedural examination: Secondary | ICD-10-CM | POA: Diagnosis not present

## 2019-05-08 DIAGNOSIS — M2012 Hallux valgus (acquired), left foot: Secondary | ICD-10-CM | POA: Diagnosis not present

## 2019-05-15 DIAGNOSIS — Z01812 Encounter for preprocedural laboratory examination: Secondary | ICD-10-CM | POA: Diagnosis not present

## 2019-05-15 DIAGNOSIS — Z20822 Contact with and (suspected) exposure to covid-19: Secondary | ICD-10-CM | POA: Diagnosis not present

## 2019-05-17 DIAGNOSIS — G8918 Other acute postprocedural pain: Secondary | ICD-10-CM | POA: Diagnosis not present

## 2019-05-17 DIAGNOSIS — M2011 Hallux valgus (acquired), right foot: Secondary | ICD-10-CM | POA: Diagnosis not present

## 2019-05-17 DIAGNOSIS — F329 Major depressive disorder, single episode, unspecified: Secondary | ICD-10-CM | POA: Diagnosis not present

## 2019-05-17 DIAGNOSIS — Z9861 Coronary angioplasty status: Secondary | ICD-10-CM | POA: Diagnosis not present

## 2019-05-17 DIAGNOSIS — K219 Gastro-esophageal reflux disease without esophagitis: Secondary | ICD-10-CM | POA: Diagnosis not present

## 2019-05-17 DIAGNOSIS — I10 Essential (primary) hypertension: Secondary | ICD-10-CM | POA: Diagnosis not present

## 2019-05-17 DIAGNOSIS — M797 Fibromyalgia: Secondary | ICD-10-CM | POA: Diagnosis not present

## 2019-05-17 DIAGNOSIS — I493 Ventricular premature depolarization: Secondary | ICD-10-CM | POA: Diagnosis not present

## 2019-05-17 DIAGNOSIS — G4700 Insomnia, unspecified: Secondary | ICD-10-CM | POA: Diagnosis not present

## 2019-05-17 DIAGNOSIS — M25571 Pain in right ankle and joints of right foot: Secondary | ICD-10-CM | POA: Diagnosis not present

## 2019-07-01 DIAGNOSIS — M2011 Hallux valgus (acquired), right foot: Secondary | ICD-10-CM | POA: Diagnosis not present

## 2019-08-19 DIAGNOSIS — K219 Gastro-esophageal reflux disease without esophagitis: Secondary | ICD-10-CM | POA: Diagnosis not present

## 2019-08-19 DIAGNOSIS — Z1211 Encounter for screening for malignant neoplasm of colon: Secondary | ICD-10-CM | POA: Diagnosis not present

## 2019-08-19 DIAGNOSIS — B351 Tinea unguium: Secondary | ICD-10-CM | POA: Diagnosis not present

## 2019-08-19 DIAGNOSIS — Z1331 Encounter for screening for depression: Secondary | ICD-10-CM | POA: Diagnosis not present

## 2019-08-19 DIAGNOSIS — Z6838 Body mass index (BMI) 38.0-38.9, adult: Secondary | ICD-10-CM | POA: Diagnosis not present

## 2019-08-21 ENCOUNTER — Other Ambulatory Visit: Payer: Self-pay | Admitting: General Surgery

## 2019-08-21 DIAGNOSIS — Z9189 Other specified personal risk factors, not elsewhere classified: Secondary | ICD-10-CM

## 2019-08-28 DIAGNOSIS — K219 Gastro-esophageal reflux disease without esophagitis: Secondary | ICD-10-CM | POA: Diagnosis not present

## 2019-08-28 DIAGNOSIS — K635 Polyp of colon: Secondary | ICD-10-CM | POA: Insufficient documentation

## 2019-08-28 DIAGNOSIS — K648 Other hemorrhoids: Secondary | ICD-10-CM | POA: Insufficient documentation

## 2019-08-28 DIAGNOSIS — Z86010 Personal history of colonic polyps: Secondary | ICD-10-CM | POA: Diagnosis not present

## 2019-09-24 ENCOUNTER — Ambulatory Visit
Admission: RE | Admit: 2019-09-24 | Discharge: 2019-09-24 | Disposition: A | Discharge status: Home / Self Care | Payer: Federal, State, Local not specified - PPO | Source: Ambulatory Visit | Attending: General Surgery | Admitting: General Surgery

## 2019-09-24 ENCOUNTER — Other Ambulatory Visit: Payer: Self-pay

## 2019-09-24 ENCOUNTER — Other Ambulatory Visit: Payer: Self-pay | Admitting: General Surgery

## 2019-09-24 DIAGNOSIS — Z9189 Other specified personal risk factors, not elsewhere classified: Secondary | ICD-10-CM

## 2019-09-24 MED ORDER — GADOBUTROL 1 MMOL/ML IV SOLN: 10.0000 mL | Freq: Once | INTRAVENOUS | Status: DC | PRN

## 2019-09-24 MED ORDER — GADOBENATE DIMEGLUMINE 529 MG/ML IV SOLN
10.0000 mL | Freq: Once | INTRAVENOUS | Status: AC | PRN
  Administered 2019-09-24: 10 mL via INTRAVENOUS

## 2019-09-26 ENCOUNTER — Ambulatory Visit
Admission: RE | Admit: 2019-09-26 | Discharge: 2019-09-26 | Disposition: A | Discharge status: Home / Self Care | Payer: Federal, State, Local not specified - PPO | Source: Ambulatory Visit | Attending: General Surgery | Admitting: General Surgery

## 2019-09-26 ENCOUNTER — Other Ambulatory Visit: Payer: Self-pay

## 2019-09-26 DIAGNOSIS — Z9189 Other specified personal risk factors, not elsewhere classified: Secondary | ICD-10-CM

## 2019-09-26 DIAGNOSIS — Z853 Personal history of malignant neoplasm of breast: Secondary | ICD-10-CM | POA: Diagnosis not present

## 2019-09-26 DIAGNOSIS — N6489 Other specified disorders of breast: Secondary | ICD-10-CM | POA: Diagnosis not present

## 2019-09-26 MED ORDER — GADOBUTROL 1 MMOL/ML IV SOLN
10.0000 mL | Freq: Once | INTRAVENOUS | Status: AC | PRN
  Administered 2019-09-26: 10 mL via INTRAVENOUS

## 2019-09-30 DIAGNOSIS — Z1152 Encounter for screening for COVID-19: Secondary | ICD-10-CM | POA: Diagnosis not present

## 2019-10-03 DIAGNOSIS — K648 Other hemorrhoids: Secondary | ICD-10-CM | POA: Diagnosis not present

## 2019-10-03 DIAGNOSIS — K5140 Inflammatory polyps of colon without complications: Secondary | ICD-10-CM | POA: Diagnosis not present

## 2019-10-03 DIAGNOSIS — Z86010 Personal history of colonic polyps: Secondary | ICD-10-CM | POA: Diagnosis not present

## 2019-10-03 DIAGNOSIS — Z1211 Encounter for screening for malignant neoplasm of colon: Secondary | ICD-10-CM | POA: Diagnosis not present

## 2019-10-03 DIAGNOSIS — K625 Hemorrhage of anus and rectum: Secondary | ICD-10-CM | POA: Diagnosis not present

## 2019-10-03 DIAGNOSIS — K317 Polyp of stomach and duodenum: Secondary | ICD-10-CM | POA: Diagnosis not present

## 2019-10-03 DIAGNOSIS — K635 Polyp of colon: Secondary | ICD-10-CM | POA: Diagnosis not present

## 2019-10-03 DIAGNOSIS — K219 Gastro-esophageal reflux disease without esophagitis: Secondary | ICD-10-CM | POA: Diagnosis not present

## 2019-10-08 DIAGNOSIS — J302 Other seasonal allergic rhinitis: Secondary | ICD-10-CM | POA: Diagnosis not present

## 2019-10-08 DIAGNOSIS — Z6838 Body mass index (BMI) 38.0-38.9, adult: Secondary | ICD-10-CM | POA: Diagnosis not present

## 2019-10-08 DIAGNOSIS — H6120 Impacted cerumen, unspecified ear: Secondary | ICD-10-CM | POA: Diagnosis not present

## 2019-10-30 DIAGNOSIS — M5442 Lumbago with sciatica, left side: Secondary | ICD-10-CM | POA: Diagnosis not present

## 2019-10-30 DIAGNOSIS — M62830 Muscle spasm of back: Secondary | ICD-10-CM | POA: Diagnosis not present

## 2019-10-30 DIAGNOSIS — M5136 Other intervertebral disc degeneration, lumbar region: Secondary | ICD-10-CM | POA: Diagnosis not present

## 2019-10-30 DIAGNOSIS — M546 Pain in thoracic spine: Secondary | ICD-10-CM | POA: Diagnosis not present

## 2019-11-01 DIAGNOSIS — M5136 Other intervertebral disc degeneration, lumbar region: Secondary | ICD-10-CM | POA: Diagnosis not present

## 2019-11-01 DIAGNOSIS — M62830 Muscle spasm of back: Secondary | ICD-10-CM | POA: Diagnosis not present

## 2019-11-01 DIAGNOSIS — M546 Pain in thoracic spine: Secondary | ICD-10-CM | POA: Diagnosis not present

## 2019-11-01 DIAGNOSIS — M5442 Lumbago with sciatica, left side: Secondary | ICD-10-CM | POA: Diagnosis not present

## 2019-11-04 DIAGNOSIS — M5136 Other intervertebral disc degeneration, lumbar region: Secondary | ICD-10-CM | POA: Diagnosis not present

## 2019-11-04 DIAGNOSIS — M62830 Muscle spasm of back: Secondary | ICD-10-CM | POA: Diagnosis not present

## 2019-11-04 DIAGNOSIS — M546 Pain in thoracic spine: Secondary | ICD-10-CM | POA: Diagnosis not present

## 2019-11-04 DIAGNOSIS — M5442 Lumbago with sciatica, left side: Secondary | ICD-10-CM | POA: Diagnosis not present

## 2019-11-06 DIAGNOSIS — M546 Pain in thoracic spine: Secondary | ICD-10-CM | POA: Diagnosis not present

## 2019-11-06 DIAGNOSIS — M5442 Lumbago with sciatica, left side: Secondary | ICD-10-CM | POA: Diagnosis not present

## 2019-11-06 DIAGNOSIS — M5136 Other intervertebral disc degeneration, lumbar region: Secondary | ICD-10-CM | POA: Diagnosis not present

## 2019-11-06 DIAGNOSIS — M62830 Muscle spasm of back: Secondary | ICD-10-CM | POA: Diagnosis not present

## 2019-11-08 DIAGNOSIS — M5442 Lumbago with sciatica, left side: Secondary | ICD-10-CM | POA: Diagnosis not present

## 2019-11-08 DIAGNOSIS — M62830 Muscle spasm of back: Secondary | ICD-10-CM | POA: Diagnosis not present

## 2019-11-08 DIAGNOSIS — M546 Pain in thoracic spine: Secondary | ICD-10-CM | POA: Diagnosis not present

## 2019-11-08 DIAGNOSIS — M5136 Other intervertebral disc degeneration, lumbar region: Secondary | ICD-10-CM | POA: Diagnosis not present

## 2019-11-11 DIAGNOSIS — M5442 Lumbago with sciatica, left side: Secondary | ICD-10-CM | POA: Diagnosis not present

## 2019-11-11 DIAGNOSIS — M62830 Muscle spasm of back: Secondary | ICD-10-CM | POA: Diagnosis not present

## 2019-11-11 DIAGNOSIS — M546 Pain in thoracic spine: Secondary | ICD-10-CM | POA: Diagnosis not present

## 2019-11-11 DIAGNOSIS — M5136 Other intervertebral disc degeneration, lumbar region: Secondary | ICD-10-CM | POA: Diagnosis not present

## 2019-11-13 DIAGNOSIS — M62830 Muscle spasm of back: Secondary | ICD-10-CM | POA: Diagnosis not present

## 2019-11-13 DIAGNOSIS — M5136 Other intervertebral disc degeneration, lumbar region: Secondary | ICD-10-CM | POA: Diagnosis not present

## 2019-11-13 DIAGNOSIS — M5442 Lumbago with sciatica, left side: Secondary | ICD-10-CM | POA: Diagnosis not present

## 2019-11-13 DIAGNOSIS — M546 Pain in thoracic spine: Secondary | ICD-10-CM | POA: Diagnosis not present

## 2019-11-14 DIAGNOSIS — M62830 Muscle spasm of back: Secondary | ICD-10-CM | POA: Diagnosis not present

## 2019-11-14 DIAGNOSIS — M5442 Lumbago with sciatica, left side: Secondary | ICD-10-CM | POA: Diagnosis not present

## 2019-11-14 DIAGNOSIS — M5136 Other intervertebral disc degeneration, lumbar region: Secondary | ICD-10-CM | POA: Diagnosis not present

## 2019-11-14 DIAGNOSIS — M546 Pain in thoracic spine: Secondary | ICD-10-CM | POA: Diagnosis not present

## 2019-11-20 DIAGNOSIS — M546 Pain in thoracic spine: Secondary | ICD-10-CM | POA: Diagnosis not present

## 2019-11-20 DIAGNOSIS — M5136 Other intervertebral disc degeneration, lumbar region: Secondary | ICD-10-CM | POA: Diagnosis not present

## 2019-11-20 DIAGNOSIS — M62830 Muscle spasm of back: Secondary | ICD-10-CM | POA: Diagnosis not present

## 2019-11-20 DIAGNOSIS — M5442 Lumbago with sciatica, left side: Secondary | ICD-10-CM | POA: Diagnosis not present

## 2019-11-21 DIAGNOSIS — M5442 Lumbago with sciatica, left side: Secondary | ICD-10-CM | POA: Diagnosis not present

## 2019-11-21 DIAGNOSIS — M546 Pain in thoracic spine: Secondary | ICD-10-CM | POA: Diagnosis not present

## 2019-11-21 DIAGNOSIS — M62830 Muscle spasm of back: Secondary | ICD-10-CM | POA: Diagnosis not present

## 2019-11-21 DIAGNOSIS — M5136 Other intervertebral disc degeneration, lumbar region: Secondary | ICD-10-CM | POA: Diagnosis not present

## 2019-11-22 DIAGNOSIS — M5136 Other intervertebral disc degeneration, lumbar region: Secondary | ICD-10-CM | POA: Diagnosis not present

## 2019-11-22 DIAGNOSIS — M5442 Lumbago with sciatica, left side: Secondary | ICD-10-CM | POA: Diagnosis not present

## 2019-11-22 DIAGNOSIS — M62830 Muscle spasm of back: Secondary | ICD-10-CM | POA: Diagnosis not present

## 2019-11-22 DIAGNOSIS — M546 Pain in thoracic spine: Secondary | ICD-10-CM | POA: Diagnosis not present

## 2019-11-25 DIAGNOSIS — M62830 Muscle spasm of back: Secondary | ICD-10-CM | POA: Diagnosis not present

## 2019-11-25 DIAGNOSIS — M5442 Lumbago with sciatica, left side: Secondary | ICD-10-CM | POA: Diagnosis not present

## 2019-11-25 DIAGNOSIS — M546 Pain in thoracic spine: Secondary | ICD-10-CM | POA: Diagnosis not present

## 2019-11-25 DIAGNOSIS — M5136 Other intervertebral disc degeneration, lumbar region: Secondary | ICD-10-CM | POA: Diagnosis not present

## 2019-11-27 DIAGNOSIS — M5442 Lumbago with sciatica, left side: Secondary | ICD-10-CM | POA: Diagnosis not present

## 2019-11-27 DIAGNOSIS — M5136 Other intervertebral disc degeneration, lumbar region: Secondary | ICD-10-CM | POA: Diagnosis not present

## 2019-11-27 DIAGNOSIS — M546 Pain in thoracic spine: Secondary | ICD-10-CM | POA: Diagnosis not present

## 2019-11-27 DIAGNOSIS — M62830 Muscle spasm of back: Secondary | ICD-10-CM | POA: Diagnosis not present

## 2019-11-28 DIAGNOSIS — D0501 Lobular carcinoma in situ of right breast: Secondary | ICD-10-CM | POA: Diagnosis not present

## 2019-11-28 DIAGNOSIS — Z9189 Other specified personal risk factors, not elsewhere classified: Secondary | ICD-10-CM | POA: Diagnosis not present

## 2019-11-29 DIAGNOSIS — M546 Pain in thoracic spine: Secondary | ICD-10-CM | POA: Diagnosis not present

## 2019-11-29 DIAGNOSIS — M5442 Lumbago with sciatica, left side: Secondary | ICD-10-CM | POA: Diagnosis not present

## 2019-11-29 DIAGNOSIS — M5136 Other intervertebral disc degeneration, lumbar region: Secondary | ICD-10-CM | POA: Diagnosis not present

## 2019-11-29 DIAGNOSIS — M62830 Muscle spasm of back: Secondary | ICD-10-CM | POA: Diagnosis not present

## 2019-12-02 DIAGNOSIS — M5136 Other intervertebral disc degeneration, lumbar region: Secondary | ICD-10-CM | POA: Diagnosis not present

## 2019-12-02 DIAGNOSIS — M546 Pain in thoracic spine: Secondary | ICD-10-CM | POA: Diagnosis not present

## 2019-12-02 DIAGNOSIS — M5442 Lumbago with sciatica, left side: Secondary | ICD-10-CM | POA: Diagnosis not present

## 2019-12-02 DIAGNOSIS — M62830 Muscle spasm of back: Secondary | ICD-10-CM | POA: Diagnosis not present

## 2019-12-03 DIAGNOSIS — K219 Gastro-esophageal reflux disease without esophagitis: Secondary | ICD-10-CM | POA: Diagnosis not present

## 2019-12-05 DIAGNOSIS — M5136 Other intervertebral disc degeneration, lumbar region: Secondary | ICD-10-CM | POA: Diagnosis not present

## 2019-12-05 DIAGNOSIS — M62830 Muscle spasm of back: Secondary | ICD-10-CM | POA: Diagnosis not present

## 2019-12-05 DIAGNOSIS — M5442 Lumbago with sciatica, left side: Secondary | ICD-10-CM | POA: Diagnosis not present

## 2019-12-05 DIAGNOSIS — M546 Pain in thoracic spine: Secondary | ICD-10-CM | POA: Diagnosis not present

## 2019-12-09 DIAGNOSIS — M546 Pain in thoracic spine: Secondary | ICD-10-CM | POA: Diagnosis not present

## 2019-12-09 DIAGNOSIS — M5136 Other intervertebral disc degeneration, lumbar region: Secondary | ICD-10-CM | POA: Diagnosis not present

## 2019-12-09 DIAGNOSIS — M62830 Muscle spasm of back: Secondary | ICD-10-CM | POA: Diagnosis not present

## 2019-12-09 DIAGNOSIS — M5442 Lumbago with sciatica, left side: Secondary | ICD-10-CM | POA: Diagnosis not present

## 2019-12-16 ENCOUNTER — Other Ambulatory Visit: Payer: Self-pay | Admitting: Obstetrics and Gynecology

## 2019-12-16 DIAGNOSIS — Z6837 Body mass index (BMI) 37.0-37.9, adult: Secondary | ICD-10-CM | POA: Diagnosis not present

## 2019-12-16 DIAGNOSIS — M5431 Sciatica, right side: Secondary | ICD-10-CM | POA: Diagnosis not present

## 2019-12-16 DIAGNOSIS — Z1231 Encounter for screening mammogram for malignant neoplasm of breast: Secondary | ICD-10-CM

## 2019-12-20 DIAGNOSIS — M545 Low back pain, unspecified: Secondary | ICD-10-CM | POA: Diagnosis not present

## 2019-12-20 DIAGNOSIS — M5441 Lumbago with sciatica, right side: Secondary | ICD-10-CM | POA: Diagnosis not present

## 2019-12-23 DIAGNOSIS — Z23 Encounter for immunization: Secondary | ICD-10-CM | POA: Diagnosis not present

## 2019-12-25 DIAGNOSIS — M545 Low back pain, unspecified: Secondary | ICD-10-CM | POA: Diagnosis not present

## 2019-12-25 DIAGNOSIS — M5441 Lumbago with sciatica, right side: Secondary | ICD-10-CM | POA: Diagnosis not present

## 2019-12-30 DIAGNOSIS — M545 Low back pain, unspecified: Secondary | ICD-10-CM | POA: Diagnosis not present

## 2019-12-30 DIAGNOSIS — M5441 Lumbago with sciatica, right side: Secondary | ICD-10-CM | POA: Diagnosis not present

## 2020-01-14 DIAGNOSIS — M5441 Lumbago with sciatica, right side: Secondary | ICD-10-CM | POA: Diagnosis not present

## 2020-01-14 DIAGNOSIS — M545 Low back pain, unspecified: Secondary | ICD-10-CM | POA: Diagnosis not present

## 2020-01-24 DIAGNOSIS — Z6837 Body mass index (BMI) 37.0-37.9, adult: Secondary | ICD-10-CM | POA: Diagnosis not present

## 2020-01-24 DIAGNOSIS — E7800 Pure hypercholesterolemia, unspecified: Secondary | ICD-10-CM | POA: Diagnosis not present

## 2020-01-24 DIAGNOSIS — R5383 Other fatigue: Secondary | ICD-10-CM | POA: Diagnosis not present

## 2020-01-24 DIAGNOSIS — M5430 Sciatica, unspecified side: Secondary | ICD-10-CM | POA: Diagnosis not present

## 2020-01-24 DIAGNOSIS — Z23 Encounter for immunization: Secondary | ICD-10-CM | POA: Diagnosis not present

## 2020-02-25 ENCOUNTER — Ambulatory Visit
Admission: RE | Admit: 2020-02-25 | Discharge: 2020-02-25 | Disposition: A | Discharge status: Home / Self Care | Payer: Federal, State, Local not specified - PPO | Source: Ambulatory Visit | Attending: Obstetrics and Gynecology | Admitting: Obstetrics and Gynecology

## 2020-02-25 ENCOUNTER — Other Ambulatory Visit: Payer: Self-pay

## 2020-02-25 DIAGNOSIS — Z1231 Encounter for screening mammogram for malignant neoplasm of breast: Secondary | ICD-10-CM

## 2020-04-16 ENCOUNTER — Other Ambulatory Visit: Payer: Self-pay | Admitting: Cardiology

## 2020-04-20 DIAGNOSIS — Z23 Encounter for immunization: Secondary | ICD-10-CM | POA: Diagnosis not present

## 2020-04-24 DIAGNOSIS — D225 Melanocytic nevi of trunk: Secondary | ICD-10-CM | POA: Diagnosis not present

## 2020-04-24 DIAGNOSIS — D1801 Hemangioma of skin and subcutaneous tissue: Secondary | ICD-10-CM | POA: Diagnosis not present

## 2020-04-24 DIAGNOSIS — L821 Other seborrheic keratosis: Secondary | ICD-10-CM | POA: Diagnosis not present

## 2020-04-24 DIAGNOSIS — L814 Other melanin hyperpigmentation: Secondary | ICD-10-CM | POA: Diagnosis not present

## 2020-06-02 DIAGNOSIS — Z1151 Encounter for screening for human papillomavirus (HPV): Secondary | ICD-10-CM | POA: Diagnosis not present

## 2020-06-02 DIAGNOSIS — Z124 Encounter for screening for malignant neoplasm of cervix: Secondary | ICD-10-CM | POA: Diagnosis not present

## 2020-06-02 DIAGNOSIS — Z01419 Encounter for gynecological examination (general) (routine) without abnormal findings: Secondary | ICD-10-CM | POA: Diagnosis not present

## 2020-06-24 DIAGNOSIS — M62830 Muscle spasm of back: Secondary | ICD-10-CM | POA: Diagnosis not present

## 2020-06-24 DIAGNOSIS — M546 Pain in thoracic spine: Secondary | ICD-10-CM | POA: Diagnosis not present

## 2020-06-24 DIAGNOSIS — M5442 Lumbago with sciatica, left side: Secondary | ICD-10-CM | POA: Diagnosis not present

## 2020-06-24 DIAGNOSIS — M5136 Other intervertebral disc degeneration, lumbar region: Secondary | ICD-10-CM | POA: Diagnosis not present

## 2020-06-25 DIAGNOSIS — M25562 Pain in left knee: Secondary | ICD-10-CM | POA: Diagnosis not present

## 2020-07-01 DIAGNOSIS — M25562 Pain in left knee: Secondary | ICD-10-CM | POA: Diagnosis not present

## 2020-07-07 DIAGNOSIS — M25562 Pain in left knee: Secondary | ICD-10-CM | POA: Diagnosis not present

## 2020-07-15 DIAGNOSIS — M25562 Pain in left knee: Secondary | ICD-10-CM | POA: Diagnosis not present

## 2020-07-16 ENCOUNTER — Ambulatory Visit: Payer: Federal, State, Local not specified - PPO | Admitting: Cardiology

## 2020-07-16 ENCOUNTER — Telehealth: Payer: Self-pay

## 2020-07-16 ENCOUNTER — Encounter: Payer: Self-pay | Admitting: Cardiology

## 2020-07-16 ENCOUNTER — Other Ambulatory Visit: Payer: Self-pay

## 2020-07-16 VITALS — BP 138/76 | HR 86 | Ht 66.0 in | Wt 242.8 lb

## 2020-07-16 DIAGNOSIS — R9431 Abnormal electrocardiogram [ECG] [EKG]: Secondary | ICD-10-CM | POA: Diagnosis not present

## 2020-07-16 DIAGNOSIS — R079 Chest pain, unspecified: Secondary | ICD-10-CM

## 2020-07-16 DIAGNOSIS — E669 Obesity, unspecified: Secondary | ICD-10-CM | POA: Diagnosis not present

## 2020-07-16 DIAGNOSIS — Z01810 Encounter for preprocedural cardiovascular examination: Secondary | ICD-10-CM | POA: Diagnosis not present

## 2020-07-16 NOTE — Telephone Encounter (Signed)
Pt being seen today with Dr. Berniece Salines. Will forward clearance note to her for appt today.

## 2020-07-16 NOTE — Telephone Encounter (Signed)
   Name: [name]  DOB: Sep 23, 1960  MRN: [id number]  Primary Cardiologist: None  Chart reviewed as part of pre-operative protocol coverage. Patient has not been seen in our office since 11/2018. Therefore,  she will require a follow-up visit in order to better assess preoperative cardiovascular risk. Looks like she has an appointment with Dr. Agustin Cree scheduled for 08/31/2020.    Pre-op covering staff: - Please schedule appointment and call patient to inform them. If patient already had an upcoming appointment within acceptable timeframe, please add "pre-op clearance" to the appointment notes so provider is aware. - Please contact requesting surgeon's office via preferred method (i.e, phone, fax) to inform them of need for appointment prior to surgery.  If applicable, this message will also be routed to pharmacy pool and/or primary cardiologist for input on holding anticoagulant/antiplatelet agent as requested below so that this information is available to the clearing provider at time of patient's appointment.   Darreld Mclean, PA-C  07/16/2020, 11:40 AM

## 2020-07-16 NOTE — Telephone Encounter (Signed)
Left message for the pt to offer an appt today at 4:20 with Dr. Harriet Masson for pre op clearance in the [facility] office where it looks looks like pt has been followed. Pt is currently scheduled to see Dr. Agustin Cree 08/31/20 in [facility] office.

## 2020-07-16 NOTE — Patient Instructions (Signed)
Medication Instructions:  Your physician recommends that you continue on your current medications as directed. Please refer to the Current Medication list given to you today.  *If you need a refill on your cardiac medications before your next appointment, please call your pharmacy*   Lab Work: None If you have labs (blood work) drawn today and your tests are completely normal, you will receive your results only by: Marland Kitchen MyChart Message (if you have MyChart) OR . A paper copy in the mail If you have any lab test that is abnormal or we need to change your treatment, we will call you to review the results.   Testing/Procedures: Your physician has requested that you have a lexiscan myoview. For further information please visit [email or web address]. Please follow instruction sheet, as given.     Follow-Up: At [facility], you and your health needs are our priority.  As part of our continuing mission to provide you with exceptional heart care, we have created designated Provider Care Teams.  These Care Teams include your primary Cardiologist (physician) and Advanced Practice Providers (APPs -  Physician Assistants and Nurse Practitioners) who all work together to provide you with the care you need, when you need it.  We recommend signing up for the patient portal called "MyChart".  Sign up information is provided on this After Visit Summary.  MyChart is used to connect with patients for Virtual Visits (Telemedicine).  Patients are able to view lab/test results, encounter notes, upcoming appointments, etc.  Non-urgent messages can be sent to your provider as well.   To learn more about what you can do with MyChart, go to [email or web address].    Your next appointment:   6 month(s)  The format for your next appointment:   In Person  Provider:   Jenne Campus, MD   Other Instructions   [facility] [address] [place], [place] [address] Phone:   [phone]    Please arrive 15 minutes prior to your appointment time for registration and insurance purposes.  The test will take approximately 3 to 4 hours to complete; you may bring reading material.  If someone comes with you to your appointment, they will need to remain in the main lobby due to limited space in the testing area. **If you are pregnant or breastfeeding, please notify the nuclear lab prior to your appointment**  How to prepare for your Myocardial Perfusion Test: . Do not eat or drink 3 hours prior to your test, except you may have water. . Do not consume products containing caffeine (regular or decaffeinated) 12 hours prior to your test. (ex: coffee, chocolate, sodas, tea). . Do bring a list of your current medications with you.  If not listed below, you may take your medications as normal. . Do wear comfortable clothes (no dresses or overalls) and walking shoes, tennis shoes preferred (No heels or open toe shoes are allowed). . Do NOT wear cologne, perfume, aftershave, or lotions (deodorant is allowed). . If these instructions are not followed, your test will have to be rescheduled.  Please report to [address] for your test.  If you have questions or concerns about your appointment, you can call the [facility] at [phone].  If you cannot keep your appointment, please provide 24 hours notification to the Nuclear Lab, to avoid a possible $50 charge to your account.

## 2020-07-16 NOTE — Progress Notes (Signed)
Cardiology Office Note:    Date:  07/17/2020   ID:  [name], DOB 01-10-1961, MRN [id number]  PCP:  Angelina Sheriff, MD  Cardiologist:  None  Electrophysiologist:  None   Referring MD: Angelina Sheriff, MD    History of Present Illness:    [name] is a 60 y.o. female with a hx of PVCs, migraine headache is here today for follow-up visit.  Patient follows with Dr. Agustin Cree and was last seen by him in September 2020.  At that time she appeared to be doing well from a cardiovascular standpoint.  She is here today at the request of her primary care doctor given the fact that she is planning knee surgery.  The patient tells that she has very limited by her knee and has not been doing lots of activities to determine or elicit any symptoms.  Past Medical History:  Diagnosis Date  . Depression    MANIAC  . Fibromyalgia   . Insomnia   . Migraine headache   . Overactive bladder     Past Surgical History:  Procedure Laterality Date  . BREAST EXCISIONAL BIOPSY Right 04/11/2019   LCIS  . EXPLORATORY LAPAROTOMY LEFT CYSTECTOMY     DERMOID ON LEFT  . LAPAROSCOPIC CHLOECYSTECTOMY  2005  . LEEP CONE    . MOLE REMOVAL     PRECANCEROUS  . [place]  . RADIOACTIVE SEED GUIDED EXCISIONAL BREAST BIOPSY Right 04/11/2019   Procedure: RADIOACTIVE SEED GUIDED EXCISIONAL RIGHT BREAST BIOPSY;  Surgeon: Rolm Bookbinder, MD;  Location: [facility];  Service: General;  Laterality: Right;  . SUBURETHRAL SLING  11/30/2009   SOLYX    Current Medications: Current Meds  Medication Sig  . famotidine (PEPCID) 20 MG tablet Take 20 mg by mouth daily.  . metoprolol succinate (TOPROL-XL) 25 MG 24 hr tablet TAKE 1 TABLET BY MOUTH DAILY, WITH OR IMMEDIATELY FOLLOWING A MEAL  . omeprazole (PRILOSEC) 20 MG capsule Take 20 mg by mouth daily.  . [DISCONTINUED] traMADol (ULTRAM) 50 MG tablet Take 1 tablet (50 mg total) by mouth every 6 (six) hours as needed.      Allergies:   Patient has no known allergies.   Social History   Socioeconomic History  . Marital status: Married    Spouse name: Not on file  . Number of children: Not on file  . Years of education: Not on file  . Highest education level: Not on file  Occupational History  . Not on file  Tobacco Use  . Smoking status: Never Smoker  . Smokeless tobacco: Never Used  Vaping Use  . Vaping Use: Never used  Substance and Sexual Activity  . Alcohol use: No  . Drug use: Never  . Sexual activity: Yes    Birth control/protection: Pill  Other Topics Concern  . Not on file  Social History Narrative  . Not on file   Social Determinants of Health   Financial Resource Strain: Not on file  Food Insecurity: Not on file  Transportation Needs: Not on file  Physical Activity: Not on file  Stress: Not on file  Social Connections: Not on file     Family History: The patient's family history includes Breast cancer in her maternal grandmother; Heart disease in her father.  ROS:   Review of Systems  Constitution: Negative for decreased appetite, fever and weight gain.  HENT: Negative for congestion, ear discharge, hoarse voice and sore throat.  Eyes: Negative for discharge, redness, vision loss in right eye and visual halos.  Cardiovascular: Negative for chest pain, dyspnea on exertion, leg swelling, orthopnea and palpitations.  Respiratory: Negative for cough, hemoptysis, shortness of breath and snoring.   Endocrine: Negative for heat intolerance and polyphagia.  Hematologic/Lymphatic: Negative for bleeding problem. Does not bruise/bleed easily.  Skin: Negative for flushing, nail changes, rash and suspicious lesions.  Musculoskeletal: Negative for arthritis, joint pain, muscle cramps, myalgias, neck pain and stiffness.  Gastrointestinal: Negative for abdominal pain, bowel incontinence, diarrhea and excessive appetite.  Genitourinary: Negative for decreased libido, genital sores and  incomplete emptying.  Neurological: Negative for brief paralysis, focal weakness, headaches and loss of balance.  Psychiatric/Behavioral: Negative for altered mental status, depression and suicidal ideas.  Allergic/Immunologic: Negative for HIV exposure and persistent infections.    EKGs/Labs/Other Studies Reviewed:    The following studies were reviewed today:   EKG:  The ekg ordered today demonstrates sinus rhythm, heart rate 86 bpm with anterior wall infarction of age-indeterminate.  Transthoracic echocardiogram March 23, 2018 Study Conclusions   - Left ventricle: The cavity size was normal. Systolic function was  normal. The estimated ejection fraction was in the range of 55%  to 60%. Wall motion was normal; there were no regional wall  motion abnormalities. Doppler parameters are consistent with  abnormal left ventricular relaxation (grade 1 diastolic  dysfunction).   Impressions:   - Grossly normal study.   -------------------------------------------------------------------  Study data: No prior study was available for comparison. Study  status: Routine. Procedure: The patient reported no pain pre or  post test. Transthoracic echocardiography. Image quality was  adequate. Study completion: There were no complications.  Transthoracic echocardiography. M-mode, complete 2D, spectral  Doppler, and color Doppler. Birthdate: Patient birthdate:  Sep 25, 1960. Age: Patient is 60 yr old. Sex: Gender: female.  BMI: 38 kg/m^2. Blood pressure:   120/80 Patient status:  Outpatient. Study date: Study date: 03/23/2018. Study time: 02:00  PM. Location: Echo laboratory.   -------------------------------------------------------------------   -------------------------------------------------------------------  Left ventricle: The cavity size was normal. Systolic function was  normal. The estimated ejection fraction was in the range of 55% to  60%. Wall  motion was normal; there were no regional wall motion  abnormalities. Doppler parameters are consistent with abnormal left  ventricular relaxation (grade 1 diastolic dysfunction).   -------------------------------------------------------------------  Aortic valve:  Trileaflet; normal thickness leaflets. Mobility was  not restricted. Doppler: Transvalvular velocity was within the  normal range. There was no stenosis. There was no regurgitation.    -------------------------------------------------------------------  Aorta: Aortic root: The aortic root was normal in size.   -------------------------------------------------------------------  Mitral valve:  Structurally normal valve.  Mobility was not  restricted. Doppler: Transvalvular velocity was within the normal  range. There was no evidence for stenosis. There was no  regurgitation.  Valve area by pressure half-time: 4 cm^2. Indexed  valve area by pressure half-time: 1.76 cm^2/m^2.  Peak gradient  (D): 3 mm Hg.   -------------------------------------------------------------------  Left atrium: The atrium was normal in size.   -------------------------------------------------------------------  Right ventricle: The cavity size was normal. Wall thickness was  normal. Systolic function was normal.   -------------------------------------------------------------------  Pulmonic valve:  Structurally normal valve.  Cusp separation was  normal. Doppler: Transvalvular velocity was within the normal  range. There was no evidence for stenosis. There was trivial  regurgitation.   -------------------------------------------------------------------  Tricuspid valve:  Structurally normal valve.  Doppler:  Transvalvular velocity was within the normal range. There was no  regurgitation.   -------------------------------------------------------------------  Pulmonary artery:  The main pulmonary artery was normal-sized.   Systolic pressure was within the normal range.   -------------------------------------------------------------------  Right atrium: The atrium was normal in size.   -------------------------------------------------------------------  Pericardium: There was no pericardial effusion.   -------------------------------------------------------------------  Systemic veins:  Inferior vena cava: The vessel was normal in size.   Recent Labs: No results found for requested labs within last 8760 hours.  Recent Lipid Panel    Component Value Date/Time   CHOL 165 10/05/2011 1513    Physical Exam:    VS:  BP 138/76   Pulse 86   Ht 5\' 6"  (1.676 m)   Wt 242 lb 12.8 oz (110.1 kg)   SpO2 97%   BMI 39.19 kg/m     Wt Readings from Last 3 Encounters:  07/16/20 242 lb 12.8 oz (110.1 kg)  04/11/19 245 lb 2.4 oz (111.2 kg)  11/20/18 245 lb 9.6 oz (111.4 kg)     GEN: Well nourished, well developed in no acute distress HEENT: Normal NECK: No JVD; No carotid bruits LYMPHATICS: No lymphadenopathy CARDIAC: S1S2 noted,RRR, no murmurs, rubs, gallops RESPIRATORY:  Clear to auscultation without rales, wheezing or rhonchi  ABDOMEN: Soft, non-tender, non-distended, +bowel sounds, no guarding. EXTREMITIES: No edema, No cyanosis, no clubbing MUSCULOSKELETAL:  No deformity  SKIN: Warm and dry NEUROLOGIC:  Alert and oriented x 3, non-focal PSYCHIATRIC:  Normal affect, good insight  ASSESSMENT:    1. Nonspecific abnormal electrocardiogram (ECG) (EKG)   2. Chest pain, unspecified type   3. Obesity (BMI 30-39.9)   4. Preoperative cardiovascular examination    PLAN:     She has very limited activity which is very hard to elicit any symptoms in this patient.  Given her abnormal EKG will be appropriate to proceed with ischemic evaluation presurgery.  A pharmacologic nuclear stress test would be appropriate at this time.  The patient is agreeable to proceed with this testing. She was educated and  all of her questions has been answered.  The patient understands the need to lose weight with diet and exercise. We have discussed specific strategies for this.  However she is limited by her left knee pain.  The patient is in agreement with the above plan. The patient left the office in stable condition.  The patient will follow up in   Medication Adjustments/Labs and Tests Ordered: Current medicines are reviewed at length with the patient today.  Concerns regarding medicines are outlined above.  Orders Placed This Encounter  Procedures  . Cardiac Stress Test: Informed Consent Details: Physician/Practitioner Attestation; Transcribe to consent form and obtain patient signature  . Cardiac Stress Test: Informed Consent Details: Physician/Practitioner Attestation; Transcribe to consent form and obtain patient signature  . MYOCARDIAL PERFUSION IMAGING  . EKG 12-Lead   No orders of the defined types were placed in this encounter.   Patient Instructions  Medication Instructions:  Your physician recommends that you continue on your current medications as directed. Please refer to the Current Medication list given to you today.  *If you need a refill on your cardiac medications before your next appointment, please call your pharmacy*   Lab Work: None If you have labs (blood work) drawn today and your tests are completely normal, you will receive your results only by: Marland Kitchen MyChart Message (if you have MyChart) OR . A paper copy in the mail If you have any lab test that is abnormal or we need to change your treatment, we will  call you to review the results.   Testing/Procedures: Your physician has requested that you have a lexiscan myoview. For further information please visit [email or web address]. Please follow instruction sheet, as given.     Follow-Up: At [facility], you and your health needs are our priority.  As part of our continuing mission to provide you with exceptional heart  care, we have created designated Provider Care Teams.  These Care Teams include your primary Cardiologist (physician) and Advanced Practice Providers (APPs -  Physician Assistants and Nurse Practitioners) who all work together to provide you with the care you need, when you need it.  We recommend signing up for the patient portal called "MyChart".  Sign up information is provided on this After Visit Summary.  MyChart is used to connect with patients for Virtual Visits (Telemedicine).  Patients are able to view lab/test results, encounter notes, upcoming appointments, etc.  Non-urgent messages can be sent to your provider as well.   To learn more about what you can do with MyChart, go to [email or web address].    Your next appointment:   6 month(s)  The format for your next appointment:   In Person  Provider:   Jenne Campus, MD   Other Instructions   [facility] [address] [place], [place] [address] Phone:  [phone]    Please arrive 15 minutes prior to your appointment time for registration and insurance purposes.  The test will take approximately 3 to 4 hours to complete; you may bring reading material.  If someone comes with you to your appointment, they will need to remain in the main lobby due to limited space in the testing area. **If you are pregnant or breastfeeding, please notify the nuclear lab prior to your appointment**  How to prepare for your Myocardial Perfusion Test: . Do not eat or drink 3 hours prior to your test, except you may have water. . Do not consume products containing caffeine (regular or decaffeinated) 12 hours prior to your test. (ex: coffee, chocolate, sodas, tea). . Do bring a list of your current medications with you.  If not listed below, you may take your medications as normal. . Do wear comfortable clothes (no dresses or overalls) and walking shoes, tennis shoes preferred (No heels or open toe shoes are  allowed). . Do NOT wear cologne, perfume, aftershave, or lotions (deodorant is allowed). . If these instructions are not followed, your test will have to be rescheduled.  Please report to [address] for your test.  If you have questions or concerns about your appointment, you can call the [facility] at [phone].  If you cannot keep your appointment, please provide 24 hours notification to the Nuclear Lab, to avoid a possible $50 charge to your account.      Adopting a Healthy Lifestyle.  Know what a healthy weight is for you (roughly BMI <25) and aim to maintain this   Aim for 7+ servings of fruits and vegetables daily   65-80+ fluid ounces of water or unsweet tea for healthy kidneys   Limit to max 1 drink of alcohol per day; avoid smoking/tobacco   Limit animal fats in diet for cholesterol and heart health - choose grass fed whenever available   Avoid highly processed foods, and foods high in saturated/trans fats   Aim for low stress - take time to unwind and care for your mental health   Aim for 150 min of moderate intensity  exercise weekly for heart health, and weights twice weekly for bone health   Aim for 7-9 hours of sleep daily   When it comes to diets, agreement about the perfect plan isnt easy to find, even among the experts. Experts at the [employer] developed an idea known as the Healthy Eating Plate. Just imagine a plate divided into logical, healthy portions.   The emphasis is on diet quality:   Load up on vegetables and fruits - one-half of your plate: Aim for color and variety, and remember that potatoes dont count.   Go for whole grains - one-quarter of your plate: Whole wheat, barley, wheat berries, quinoa, oats, brown rice, and foods made with them. If you want pasta, go with whole wheat pasta.   Protein power - one-quarter of your plate: Fish, chicken, beans, and nuts are all healthy,  versatile protein sources. Limit red meat.   The diet, however, does go beyond the plate, offering a few other suggestions.   Use healthy plant oils, such as olive, canola, soy, corn, sunflower and peanut. Check the labels, and avoid partially hydrogenated oil, which have unhealthy trans fats.   If youre thirsty, drink water. Coffee and tea are good in moderation, but skip sugary drinks and limit milk and dairy products to one or two daily servings.   The type of carbohydrate in the diet is more important than the amount. Some sources of carbohydrates, such as vegetables, fruits, whole grains, and beans-are healthier than others.   Finally, stay active  Signed, Berniece Salines, DO  07/17/2020 7:59 PM    [facility]

## 2020-07-16 NOTE — Telephone Encounter (Signed)
   [facility] Pre-operative Risk Assessment    Request for surgical clearance:  1. What type of surgery is being performed? Left Knee Scope   2. When is this surgery scheduled? TBD   What type of clearance is required (medical clearance vs. Pharmacy clearance to hold med vs. Both)?None specified   3. Are there any medications that need to be held prior to surgery and how long?   4. Practice name and name of physician performing surgery? [name] at [facility]  5. What is your office phone number: [phone]    7.   What is your office fax number: [phone]  8.   Anesthesia type (None, local, MAC, general) ? Not specified   [name] [name] 07/16/2020, 11:30 AM  _________________________________________________________________   (provider comments below)

## 2020-07-17 DIAGNOSIS — Z01810 Encounter for preprocedural cardiovascular examination: Secondary | ICD-10-CM | POA: Insufficient documentation

## 2020-07-17 DIAGNOSIS — R079 Chest pain, unspecified: Secondary | ICD-10-CM | POA: Insufficient documentation

## 2020-07-17 DIAGNOSIS — R9431 Abnormal electrocardiogram [ECG] [EKG]: Secondary | ICD-10-CM | POA: Insufficient documentation

## 2020-07-17 DIAGNOSIS — E669 Obesity, unspecified: Secondary | ICD-10-CM | POA: Insufficient documentation

## 2020-07-20 ENCOUNTER — Telehealth (HOSPITAL_COMMUNITY): Payer: Self-pay | Admitting: *Deleted

## 2020-07-20 NOTE — Telephone Encounter (Signed)
Patient given detailed instructions per Myocardial Perfusion Study Information Sheet for the test on 07/21/20. Patient notified to arrive 15 minutes early and that it is imperative to arrive on time for appointment to keep from having the test rescheduled.  If you need to cancel or reschedule your appointment, please call the office within 24 hours of your appointment. . Patient verbalized understanding. Kirstie Peri

## 2020-07-21 ENCOUNTER — Other Ambulatory Visit: Payer: Self-pay

## 2020-07-21 ENCOUNTER — Ambulatory Visit (INDEPENDENT_AMBULATORY_CARE_PROVIDER_SITE_OTHER): Payer: Federal, State, Local not specified - PPO

## 2020-07-21 DIAGNOSIS — R9431 Abnormal electrocardiogram [ECG] [EKG]: Secondary | ICD-10-CM

## 2020-07-21 MED ORDER — TECHNETIUM TC 99M TETROFOSMIN IV KIT
31.4000 | PACK | Freq: Once | INTRAVENOUS | Status: AC | PRN
  Administered 2020-07-21: 31.4 via INTRAVENOUS

## 2020-07-21 MED ORDER — REGADENOSON 0.4 MG/5ML IV SOLN
0.4000 mg | Freq: Once | INTRAVENOUS | Status: AC
  Administered 2020-07-21: 0.4 mg via INTRAVENOUS

## 2020-07-22 ENCOUNTER — Ambulatory Visit: Payer: Federal, State, Local not specified - PPO

## 2020-07-22 MED ORDER — TECHNETIUM TC 99M TETROFOSMIN IV KIT
32.4000 | PACK | Freq: Once | INTRAVENOUS | Status: AC | PRN
  Administered 2020-07-22: 32.4 via INTRAVENOUS

## 2020-07-23 LAB — MYOCARDIAL PERFUSION IMAGING
LV dias vol: 88 mL (ref 46–106)
LV sys vol: 23 mL
Peak HR: 107 {beats}/min
Rest HR: 71 {beats}/min
SDS: 1
SRS: 5
SSS: 6
TID: 0.89

## 2020-08-06 ENCOUNTER — Telehealth: Payer: Self-pay | Admitting: Cardiology

## 2020-08-06 NOTE — Telephone Encounter (Signed)
   Name: [name]  DOB: 1960-11-01  MRN: [id number]   Primary Cardiologist: None  Chart reviewed as part of pre-operative protocol coverage. Patient was contacted 08/06/2020 in reference to pre-operative risk assessment for pending surgery as outlined below.  [name] was last seen on 07/16/2020 by Dr. Harriet Masson.  She underwent a Lexiscan nuclear stress test on 07/21/2020, that was low risk. Since that day, [name] has done well and has been without symptoms of chest pain or dyspnea.  Dr. Bettina Gavia has reviewed the chart and indicates "She has been seen by both Dr. Agustin Cree and Dr. Jorene Minors Myocardial perfusion study was normal she is optimized for planned surgical procedure "   Therefore, based on ACC/AHA guidelines, the patient would be at acceptable risk for the planned procedure without further cardiovascular testing.   The patient was advised that if she develops new symptoms prior to surgery to contact our office to arrange for a follow-up visit, and she verbalized understanding.  I will route this recommendation to the requesting party via Epic fax function and remove from pre-op pool. Please call with questions.  Christell Faith, PA-C 08/06/2020, 3:55 PM            [facility] Pre-operative Risk Assessment    Request for surgical clearance:  1. What type of surgery is being performed? Left Knee Scope   2. When is this surgery scheduled? TBD   What type of clearance is required (medical clearance vs. Pharmacy clearance to hold med vs. Both)?None specified   3. Are there any medications that need to be held prior to surgery and how long?   4. Practice name and name of physician performing surgery? Edmonia Lynch at [facility]  5. What is your office phone number: [phone]    7.   What is your office fax number: [phone]  8.   Anesthesia type (None, local, MAC, general) ? Not specified   [name] 07/16/2020, 11:30 AM  _________________________________________________________________   (provider comments below)

## 2020-08-06 NOTE — Telephone Encounter (Signed)
Follow up:   Patient calling to check the status of her medical clearance. Please call patient.

## 2020-08-06 NOTE — Telephone Encounter (Signed)
Patient of Dr. Agustin Cree, recently evaluated by Dr. Harriet Masson for abnormal EKG. Subsequent Lexiscan MPI was low risk. Will route to Drs. Krasowki and Tobb to provide final recommendations for preoperative risk stratification.   Pre-op staff, please notify the patient we have routed the updated call her her cardiologist.

## 2020-08-06 NOTE — Telephone Encounter (Signed)
I s/w the pt and stated pre op provider has forwarded clearance notes to Dr. Harriet Masson and Dr. Agustin Cree for final input on clearance. I assured the pt once we have clearance we will let her know. I did state to the pt that we are open tomorrow though we are closed on Monday for the holiday. Pt thanked me for the call and the update.

## 2020-08-18 DIAGNOSIS — N390 Urinary tract infection, site not specified: Secondary | ICD-10-CM | POA: Diagnosis not present

## 2020-08-18 DIAGNOSIS — Z6838 Body mass index (BMI) 38.0-38.9, adult: Secondary | ICD-10-CM | POA: Diagnosis not present

## 2020-08-31 ENCOUNTER — Ambulatory Visit: Payer: Federal, State, Local not specified - PPO | Admitting: Cardiology

## 2020-09-16 ENCOUNTER — Other Ambulatory Visit: Payer: Self-pay | Admitting: Cardiology

## 2020-09-17 NOTE — Telephone Encounter (Signed)
Metoprolol Succinate 25 mg # 90 x 3 refills sent to [facility], [place] - [address] AT [address]

## 2020-09-21 ENCOUNTER — Other Ambulatory Visit: Payer: Self-pay | Admitting: General Surgery

## 2020-09-21 DIAGNOSIS — Z1239 Encounter for other screening for malignant neoplasm of breast: Secondary | ICD-10-CM

## 2020-10-19 ENCOUNTER — Other Ambulatory Visit: Payer: Self-pay

## 2020-10-19 ENCOUNTER — Ambulatory Visit
Admission: RE | Admit: 2020-10-19 | Discharge: 2020-10-19 | Disposition: A | Discharge status: Home / Self Care | Payer: Federal, State, Local not specified - PPO | Source: Ambulatory Visit | Attending: General Surgery | Admitting: General Surgery

## 2020-10-19 DIAGNOSIS — N6489 Other specified disorders of breast: Secondary | ICD-10-CM | POA: Diagnosis not present

## 2020-10-19 DIAGNOSIS — Z1239 Encounter for other screening for malignant neoplasm of breast: Secondary | ICD-10-CM

## 2020-10-19 MED ORDER — GADOBUTROL 1 MMOL/ML IV SOLN
10.0000 mL | Freq: Once | INTRAVENOUS | Status: AC | PRN
  Administered 2020-10-19: 10 mL via INTRAVENOUS

## 2020-10-22 DIAGNOSIS — S83242A Other tear of medial meniscus, current injury, left knee, initial encounter: Secondary | ICD-10-CM | POA: Diagnosis not present

## 2020-10-22 DIAGNOSIS — Y999 Unspecified external cause status: Secondary | ICD-10-CM | POA: Diagnosis not present

## 2020-10-22 DIAGNOSIS — S83232A Complex tear of medial meniscus, current injury, left knee, initial encounter: Secondary | ICD-10-CM | POA: Diagnosis not present

## 2020-10-22 DIAGNOSIS — M2242 Chondromalacia patellae, left knee: Secondary | ICD-10-CM | POA: Diagnosis not present

## 2020-10-22 DIAGNOSIS — G8918 Other acute postprocedural pain: Secondary | ICD-10-CM | POA: Diagnosis not present

## 2020-10-22 DIAGNOSIS — X58XXXA Exposure to other specified factors, initial encounter: Secondary | ICD-10-CM | POA: Diagnosis not present

## 2020-11-17 DIAGNOSIS — N939 Abnormal uterine and vaginal bleeding, unspecified: Secondary | ICD-10-CM | POA: Diagnosis not present

## 2020-11-24 DIAGNOSIS — Z1239 Encounter for other screening for malignant neoplasm of breast: Secondary | ICD-10-CM | POA: Diagnosis not present

## 2021-01-07 ENCOUNTER — Other Ambulatory Visit: Payer: Self-pay | Admitting: Obstetrics and Gynecology

## 2021-01-07 DIAGNOSIS — Z1231 Encounter for screening mammogram for malignant neoplasm of breast: Secondary | ICD-10-CM

## 2021-02-26 ENCOUNTER — Ambulatory Visit
Admission: RE | Admit: 2021-02-26 | Discharge: 2021-02-26 | Disposition: A | Discharge status: Home / Self Care | Payer: Federal, State, Local not specified - PPO | Source: Ambulatory Visit | Attending: Obstetrics and Gynecology | Admitting: Obstetrics and Gynecology

## 2021-02-26 DIAGNOSIS — Z1231 Encounter for screening mammogram for malignant neoplasm of breast: Secondary | ICD-10-CM | POA: Diagnosis not present

## 2021-03-02 ENCOUNTER — Other Ambulatory Visit: Payer: Self-pay | Admitting: Obstetrics and Gynecology

## 2021-03-02 DIAGNOSIS — R928 Other abnormal and inconclusive findings on diagnostic imaging of breast: Secondary | ICD-10-CM

## 2021-03-25 ENCOUNTER — Other Ambulatory Visit: Payer: Self-pay | Admitting: Obstetrics and Gynecology

## 2021-03-25 ENCOUNTER — Ambulatory Visit
Admission: RE | Admit: 2021-03-25 | Discharge: 2021-03-25 | Disposition: A | Discharge status: Home / Self Care | Payer: Federal, State, Local not specified - PPO | Source: Ambulatory Visit | Attending: Obstetrics and Gynecology | Admitting: Obstetrics and Gynecology

## 2021-03-25 DIAGNOSIS — R928 Other abnormal and inconclusive findings on diagnostic imaging of breast: Secondary | ICD-10-CM

## 2021-03-25 DIAGNOSIS — N6489 Other specified disorders of breast: Secondary | ICD-10-CM

## 2021-03-25 DIAGNOSIS — R922 Inconclusive mammogram: Secondary | ICD-10-CM | POA: Diagnosis not present

## 2021-04-08 ENCOUNTER — Ambulatory Visit
Admission: RE | Admit: 2021-04-08 | Discharge: 2021-04-08 | Disposition: A | Discharge status: Home / Self Care | Payer: Federal, State, Local not specified - PPO | Source: Ambulatory Visit | Attending: Obstetrics and Gynecology | Admitting: Obstetrics and Gynecology

## 2021-04-08 DIAGNOSIS — R928 Other abnormal and inconclusive findings on diagnostic imaging of breast: Secondary | ICD-10-CM | POA: Diagnosis not present

## 2021-04-08 DIAGNOSIS — N6011 Diffuse cystic mastopathy of right breast: Secondary | ICD-10-CM | POA: Diagnosis not present

## 2021-04-08 DIAGNOSIS — N6489 Other specified disorders of breast: Secondary | ICD-10-CM

## 2021-04-12 ENCOUNTER — Other Ambulatory Visit: Payer: Federal, State, Local not specified - PPO

## 2021-04-13 DIAGNOSIS — K644 Residual hemorrhoidal skin tags: Secondary | ICD-10-CM | POA: Diagnosis not present

## 2021-04-13 DIAGNOSIS — R12 Heartburn: Secondary | ICD-10-CM | POA: Diagnosis not present

## 2021-04-14 ENCOUNTER — Other Ambulatory Visit: Payer: Federal, State, Local not specified - PPO

## 2021-04-27 DIAGNOSIS — D225 Melanocytic nevi of trunk: Secondary | ICD-10-CM | POA: Diagnosis not present

## 2021-04-27 DIAGNOSIS — L821 Other seborrheic keratosis: Secondary | ICD-10-CM | POA: Diagnosis not present

## 2021-04-27 DIAGNOSIS — L814 Other melanin hyperpigmentation: Secondary | ICD-10-CM | POA: Diagnosis not present

## 2021-04-27 DIAGNOSIS — L578 Other skin changes due to chronic exposure to nonionizing radiation: Secondary | ICD-10-CM | POA: Diagnosis not present

## 2021-05-07 ENCOUNTER — Other Ambulatory Visit: Payer: Self-pay | Admitting: General Surgery

## 2021-05-07 DIAGNOSIS — N6081 Other benign mammary dysplasias of right breast: Secondary | ICD-10-CM | POA: Diagnosis not present

## 2021-05-10 ENCOUNTER — Other Ambulatory Visit: Payer: Self-pay | Admitting: General Surgery

## 2021-05-10 DIAGNOSIS — N6081 Other benign mammary dysplasias of right breast: Secondary | ICD-10-CM

## 2021-06-01 ENCOUNTER — Other Ambulatory Visit: Payer: Self-pay

## 2021-06-01 ENCOUNTER — Encounter (HOSPITAL_BASED_OUTPATIENT_CLINIC_OR_DEPARTMENT_OTHER): Payer: Self-pay | Admitting: General Surgery

## 2021-06-08 ENCOUNTER — Ambulatory Visit
Admission: RE | Admit: 2021-06-08 | Discharge: 2021-06-08 | Disposition: A | Discharge status: Home / Self Care | Payer: Federal, State, Local not specified - PPO | Source: Ambulatory Visit | Attending: General Surgery | Admitting: General Surgery

## 2021-06-08 ENCOUNTER — Encounter (HOSPITAL_BASED_OUTPATIENT_CLINIC_OR_DEPARTMENT_OTHER)
Admission: RE | Admit: 2021-06-08 | Discharge: 2021-06-08 | Disposition: A | Discharge status: Home / Self Care | Payer: Federal, State, Local not specified - PPO | Source: Ambulatory Visit | Attending: General Surgery | Admitting: General Surgery

## 2021-06-08 DIAGNOSIS — N6081 Other benign mammary dysplasias of right breast: Secondary | ICD-10-CM

## 2021-06-08 DIAGNOSIS — R928 Other abnormal and inconclusive findings on diagnostic imaging of breast: Secondary | ICD-10-CM | POA: Diagnosis not present

## 2021-06-08 DIAGNOSIS — Z01810 Encounter for preprocedural cardiovascular examination: Secondary | ICD-10-CM | POA: Insufficient documentation

## 2021-06-08 DIAGNOSIS — N6489 Other specified disorders of breast: Secondary | ICD-10-CM | POA: Diagnosis not present

## 2021-06-08 DIAGNOSIS — K219 Gastro-esophageal reflux disease without esophagitis: Secondary | ICD-10-CM | POA: Diagnosis not present

## 2021-06-08 DIAGNOSIS — Z6838 Body mass index (BMI) 38.0-38.9, adult: Secondary | ICD-10-CM | POA: Diagnosis not present

## 2021-06-08 DIAGNOSIS — Z803 Family history of malignant neoplasm of breast: Secondary | ICD-10-CM | POA: Diagnosis not present

## 2021-06-08 DIAGNOSIS — E669 Obesity, unspecified: Secondary | ICD-10-CM | POA: Diagnosis not present

## 2021-06-08 DIAGNOSIS — C50911 Malignant neoplasm of unspecified site of right female breast: Secondary | ICD-10-CM | POA: Diagnosis not present

## 2021-06-08 DIAGNOSIS — I493 Ventricular premature depolarization: Secondary | ICD-10-CM | POA: Diagnosis not present

## 2021-06-08 MED ORDER — ENSURE PRE-SURGERY PO LIQD: 296.0000 mL | Freq: Once | ORAL | Status: DC

## 2021-06-08 NOTE — Progress Notes (Signed)
RN reviewed EKG tracing with Dr. Glennon Mac.  Dr. Glennon Mac ordered pt may proceed to have surgery here tomorrow 06/09/21 at the [facility]. ?

## 2021-06-08 NOTE — Progress Notes (Signed)

## 2021-06-09 ENCOUNTER — Ambulatory Visit (HOSPITAL_BASED_OUTPATIENT_CLINIC_OR_DEPARTMENT_OTHER): Payer: Federal, State, Local not specified - PPO | Admitting: Certified Registered"

## 2021-06-09 ENCOUNTER — Encounter (HOSPITAL_BASED_OUTPATIENT_CLINIC_OR_DEPARTMENT_OTHER)
Admission: RE | Disposition: A | Discharge status: Home / Self Care | Payer: Self-pay | Source: Home / Self Care | Attending: General Surgery

## 2021-06-09 ENCOUNTER — Encounter (HOSPITAL_BASED_OUTPATIENT_CLINIC_OR_DEPARTMENT_OTHER): Payer: Self-pay | Admitting: General Surgery

## 2021-06-09 ENCOUNTER — Other Ambulatory Visit: Payer: Self-pay

## 2021-06-09 ENCOUNTER — Ambulatory Visit (HOSPITAL_BASED_OUTPATIENT_CLINIC_OR_DEPARTMENT_OTHER)
Admission: RE | Admit: 2021-06-09 | Discharge: 2021-06-09 | Disposition: A | Discharge status: Home / Self Care | Payer: Federal, State, Local not specified - PPO | Attending: General Surgery | Admitting: General Surgery

## 2021-06-09 ENCOUNTER — Ambulatory Visit
Admission: RE | Admit: 2021-06-09 | Discharge: 2021-06-09 | Disposition: A | Discharge status: Home / Self Care | Payer: Federal, State, Local not specified - PPO | Source: Ambulatory Visit | Attending: General Surgery | Admitting: General Surgery

## 2021-06-09 DIAGNOSIS — R928 Other abnormal and inconclusive findings on diagnostic imaging of breast: Secondary | ICD-10-CM | POA: Diagnosis not present

## 2021-06-09 DIAGNOSIS — N6489 Other specified disorders of breast: Secondary | ICD-10-CM | POA: Diagnosis not present

## 2021-06-09 DIAGNOSIS — Z803 Family history of malignant neoplasm of breast: Secondary | ICD-10-CM | POA: Diagnosis not present

## 2021-06-09 DIAGNOSIS — C50911 Malignant neoplasm of unspecified site of right female breast: Secondary | ICD-10-CM | POA: Insufficient documentation

## 2021-06-09 DIAGNOSIS — E669 Obesity, unspecified: Secondary | ICD-10-CM | POA: Diagnosis not present

## 2021-06-09 DIAGNOSIS — F418 Other specified anxiety disorders: Secondary | ICD-10-CM | POA: Diagnosis not present

## 2021-06-09 DIAGNOSIS — K219 Gastro-esophageal reflux disease without esophagitis: Secondary | ICD-10-CM | POA: Insufficient documentation

## 2021-06-09 DIAGNOSIS — N6081 Other benign mammary dysplasias of right breast: Secondary | ICD-10-CM | POA: Diagnosis not present

## 2021-06-09 DIAGNOSIS — Z6838 Body mass index (BMI) 38.0-38.9, adult: Secondary | ICD-10-CM | POA: Diagnosis not present

## 2021-06-09 DIAGNOSIS — N62 Hypertrophy of breast: Secondary | ICD-10-CM | POA: Diagnosis not present

## 2021-06-09 DIAGNOSIS — N6031 Fibrosclerosis of right breast: Secondary | ICD-10-CM | POA: Diagnosis not present

## 2021-06-09 DIAGNOSIS — I493 Ventricular premature depolarization: Secondary | ICD-10-CM | POA: Diagnosis not present

## 2021-06-09 DIAGNOSIS — D241 Benign neoplasm of right breast: Secondary | ICD-10-CM | POA: Diagnosis not present

## 2021-06-09 HISTORY — DX: Ventricular premature depolarization: I49.3

## 2021-06-09 HISTORY — PX: RADIOACTIVE SEED GUIDED EXCISIONAL BREAST BIOPSY: SHX6490

## 2021-06-09 HISTORY — DX: Gastro-esophageal reflux disease without esophagitis: K21.9

## 2021-06-09 LAB — POCT PREGNANCY, URINE: Preg Test, Ur: NEGATIVE

## 2021-06-09 SURGERY — RADIOACTIVE SEED GUIDED BREAST BIOPSY
Anesthesia: General | Site: Breast | Laterality: Right

## 2021-06-09 MED ORDER — ACETAMINOPHEN 500 MG PO TABS: 1000.0000 mg | ORAL_TABLET | Freq: Once | ORAL | Status: AC

## 2021-06-09 MED ORDER — MIDAZOLAM HCL 5 MG/5ML IJ SOLN
INTRAMUSCULAR | Status: DC | PRN
  Administered 2021-06-09: 2 mg via INTRAVENOUS

## 2021-06-09 MED ORDER — EPHEDRINE 5 MG/ML INJ
INTRAVENOUS | Status: AC
  Filled 2021-06-09: qty 15

## 2021-06-09 MED ORDER — MIDAZOLAM HCL 2 MG/2ML IJ SOLN
INTRAMUSCULAR | Status: AC
  Filled 2021-06-09: qty 2

## 2021-06-09 MED ORDER — LACTATED RINGERS IV SOLN: INTRAVENOUS | Status: DC

## 2021-06-09 MED ORDER — OXYCODONE HCL 5 MG PO TABS
5.0000 mg | ORAL_TABLET | Freq: Once | ORAL | Status: AC | PRN
  Administered 2021-06-09: 5 mg via ORAL

## 2021-06-09 MED ORDER — AMISULPRIDE (ANTIEMETIC) 5 MG/2ML IV SOLN: 10.0000 mg | Freq: Once | INTRAVENOUS | Status: DC | PRN

## 2021-06-09 MED ORDER — LIDOCAINE 2% (20 MG/ML) 5 ML SYRINGE
INTRAMUSCULAR | Status: DC | PRN
Start: 2021-06-09 — End: 2021-06-09
  Administered 2021-06-09: 60 mg via INTRAVENOUS

## 2021-06-09 MED ORDER — ACETAMINOPHEN 500 MG PO TABS
ORAL_TABLET | ORAL | Status: AC
  Filled 2021-06-09: qty 2

## 2021-06-09 MED ORDER — PHENYLEPHRINE 40 MCG/ML (10ML) SYRINGE FOR IV PUSH (FOR BLOOD PRESSURE SUPPORT)
PREFILLED_SYRINGE | INTRAVENOUS | Status: AC
  Filled 2021-06-09: qty 30

## 2021-06-09 MED ORDER — CEFAZOLIN SODIUM-DEXTROSE 2-4 GM/100ML-% IV SOLN
INTRAVENOUS | Status: AC
  Filled 2021-06-09: qty 100

## 2021-06-09 MED ORDER — PROPOFOL 10 MG/ML IV BOLUS
INTRAVENOUS | Status: DC | PRN
  Administered 2021-06-09: 200 mg via INTRAVENOUS

## 2021-06-09 MED ORDER — OXYCODONE HCL 5 MG/5ML PO SOLN: 5.0000 mg | Freq: Once | ORAL | Status: AC | PRN

## 2021-06-09 MED ORDER — BUPIVACAINE HCL (PF) 0.25 % IJ SOLN
INTRAMUSCULAR | Status: DC | PRN
  Administered 2021-06-09: 10 mL

## 2021-06-09 MED ORDER — CEFAZOLIN SODIUM-DEXTROSE 2-4 GM/100ML-% IV SOLN
2.0000 g | INTRAVENOUS | Status: AC
  Administered 2021-06-09: 2 g via INTRAVENOUS

## 2021-06-09 MED ORDER — CHLORHEXIDINE GLUCONATE CLOTH 2 % EX PADS: 6.0000 | MEDICATED_PAD | Freq: Once | CUTANEOUS | Status: DC

## 2021-06-09 MED ORDER — ACETAMINOPHEN 500 MG PO TABS
1000.0000 mg | ORAL_TABLET | ORAL | Status: AC
Start: 2021-06-09 — End: 2021-06-09
  Administered 2021-06-09: 1000 mg via ORAL

## 2021-06-09 MED ORDER — SCOPOLAMINE 1 MG/3DAYS TD PT72
1.0000 | MEDICATED_PATCH | TRANSDERMAL | Status: DC
  Administered 2021-06-09: 1.5 mg via TRANSDERMAL

## 2021-06-09 MED ORDER — FENTANYL CITRATE (PF) 100 MCG/2ML IJ SOLN: 25.0000 ug | INTRAMUSCULAR | Status: DC | PRN

## 2021-06-09 MED ORDER — OXYCODONE HCL 5 MG PO TABS
ORAL_TABLET | ORAL | Status: AC
  Filled 2021-06-09: qty 1

## 2021-06-09 MED ORDER — FENTANYL CITRATE (PF) 100 MCG/2ML IJ SOLN
INTRAMUSCULAR | Status: DC | PRN
  Administered 2021-06-09 (×2): 50 ug via INTRAVENOUS

## 2021-06-09 MED ORDER — ONDANSETRON HCL 4 MG/2ML IJ SOLN
INTRAMUSCULAR | Status: AC
  Filled 2021-06-09: qty 4

## 2021-06-09 MED ORDER — EPHEDRINE SULFATE (PRESSORS) 50 MG/ML IJ SOLN
INTRAMUSCULAR | Status: DC | PRN
  Administered 2021-06-09: 10 mg via INTRAVENOUS

## 2021-06-09 MED ORDER — DEXAMETHASONE SODIUM PHOSPHATE 4 MG/ML IJ SOLN
INTRAMUSCULAR | Status: DC | PRN
  Administered 2021-06-09: 4 mg via INTRAVENOUS

## 2021-06-09 MED ORDER — LIDOCAINE 2% (20 MG/ML) 5 ML SYRINGE
INTRAMUSCULAR | Status: AC
  Filled 2021-06-09: qty 5

## 2021-06-09 MED ORDER — DEXAMETHASONE SODIUM PHOSPHATE 10 MG/ML IJ SOLN
INTRAMUSCULAR | Status: AC
  Filled 2021-06-09: qty 1

## 2021-06-09 MED ORDER — PROPOFOL 500 MG/50ML IV EMUL
INTRAVENOUS | Status: DC | PRN
  Administered 2021-06-09: 25 ug/kg/min via INTRAVENOUS

## 2021-06-09 MED ORDER — ONDANSETRON HCL 4 MG/2ML IJ SOLN
INTRAMUSCULAR | Status: DC | PRN
  Administered 2021-06-09: 4 mg via INTRAVENOUS

## 2021-06-09 MED ORDER — ONDANSETRON HCL 4 MG/2ML IJ SOLN: 4.0000 mg | Freq: Once | INTRAMUSCULAR | Status: DC | PRN

## 2021-06-09 MED ORDER — SCOPOLAMINE 1 MG/3DAYS TD PT72
MEDICATED_PATCH | TRANSDERMAL | Status: AC
  Filled 2021-06-09: qty 1

## 2021-06-09 MED ORDER — FENTANYL CITRATE (PF) 100 MCG/2ML IJ SOLN
INTRAMUSCULAR | Status: AC
  Filled 2021-06-09: qty 2

## 2021-06-09 SURGICAL SUPPLY — 60 items
ADH SKN CLS APL DERMABOND .7 (GAUZE/BANDAGES/DRESSINGS) ×1
APL PRP STRL LF DISP 70% ISPRP (MISCELLANEOUS) ×1
APPLIER CLIP 9.375 MED OPEN (MISCELLANEOUS)
APR CLP MED 9.3 20 MLT OPN (MISCELLANEOUS)
BINDER BREAST LRG (GAUZE/BANDAGES/DRESSINGS) IMPLANT
BINDER BREAST MEDIUM (GAUZE/BANDAGES/DRESSINGS) IMPLANT
BINDER BREAST XLRG (GAUZE/BANDAGES/DRESSINGS) IMPLANT
BINDER BREAST XXLRG (GAUZE/BANDAGES/DRESSINGS) ×1 IMPLANT
BLADE SURG 15 STRL LF DISP TIS (BLADE) ×1 IMPLANT
BLADE SURG 15 STRL SS (BLADE) ×2
CANISTER SUC SOCK COL 7IN (MISCELLANEOUS) IMPLANT
CANISTER SUCT 1200ML W/VALVE (MISCELLANEOUS) IMPLANT
CHLORAPREP W/TINT 26 (MISCELLANEOUS) ×2 IMPLANT
CLIP APPLIE 9.375 MED OPEN (MISCELLANEOUS) IMPLANT
CLIP TI WIDE RED SMALL 6 (CLIP) IMPLANT
COVER BACK TABLE 60X90IN (DRAPES) ×2 IMPLANT
COVER MAYO STAND STRL (DRAPES) ×2 IMPLANT
COVER PROBE W GEL 5X96 (DRAPES) ×2 IMPLANT
DERMABOND ADVANCED (GAUZE/BANDAGES/DRESSINGS) ×1
DERMABOND ADVANCED .7 DNX12 (GAUZE/BANDAGES/DRESSINGS) ×1 IMPLANT
DRAPE LAPAROSCOPIC ABDOMINAL (DRAPES) ×2 IMPLANT
DRAPE UTILITY XL STRL (DRAPES) ×2 IMPLANT
DRSG TEGADERM 4X4.75 (GAUZE/BANDAGES/DRESSINGS) IMPLANT
ELECT COATED BLADE 2.86 ST (ELECTRODE) ×2 IMPLANT
ELECT REM PT RETURN 9FT ADLT (ELECTROSURGICAL) ×2
ELECTRODE REM PT RTRN 9FT ADLT (ELECTROSURGICAL) ×1 IMPLANT
GAUZE SPONGE 4X4 12PLY STRL LF (GAUZE/BANDAGES/DRESSINGS) IMPLANT
GLOVE SURG ENC MOIS LTX SZ7 (GLOVE) ×4 IMPLANT
GLOVE SURG POLYISO LF SZ6.5 (GLOVE) ×1 IMPLANT
GLOVE SURG POLYISO LF SZ7 (GLOVE) ×1 IMPLANT
GLOVE SURG UNDER POLY LF SZ7 (GLOVE) ×2 IMPLANT
GLOVE SURG UNDER POLY LF SZ7.5 (GLOVE) ×2 IMPLANT
GOWN STRL REUS W/ TWL LRG LVL3 (GOWN DISPOSABLE) ×2 IMPLANT
GOWN STRL REUS W/ TWL XL LVL3 (GOWN DISPOSABLE) IMPLANT
GOWN STRL REUS W/TWL LRG LVL3 (GOWN DISPOSABLE) ×4
GOWN STRL REUS W/TWL XL LVL3 (GOWN DISPOSABLE) ×2
HEMOSTAT ARISTA ABSORB 3G PWDR (HEMOSTASIS) IMPLANT
KIT MARKER MARGIN INK (KITS) ×2 IMPLANT
NDL HYPO 25X1 1.5 SAFETY (NEEDLE) ×1 IMPLANT
NEEDLE HYPO 25X1 1.5 SAFETY (NEEDLE) ×2 IMPLANT
NS IRRIG 1000ML POUR BTL (IV SOLUTION) IMPLANT
PACK BASIN DAY SURGERY FS (CUSTOM PROCEDURE TRAY) ×2 IMPLANT
PENCIL SMOKE EVACUATOR (MISCELLANEOUS) ×2 IMPLANT
RETRACTOR ONETRAX LX 90X20 (MISCELLANEOUS) IMPLANT
SLEEVE SCD COMPRESS KNEE MED (STOCKING) ×2 IMPLANT
SPIKE FLUID TRANSFER (MISCELLANEOUS) IMPLANT
SPONGE T-LAP 4X18 ~~LOC~~+RFID (SPONGE) ×2 IMPLANT
STRIP CLOSURE SKIN 1/2X4 (GAUZE/BANDAGES/DRESSINGS) ×2 IMPLANT
SUT MNCRL AB 4-0 PS2 18 (SUTURE) ×1 IMPLANT
SUT MON AB 5-0 PS2 18 (SUTURE) ×1 IMPLANT
SUT SILK 2 0 SH (SUTURE) IMPLANT
SUT VIC AB 2-0 SH 27 (SUTURE) ×2
SUT VIC AB 2-0 SH 27XBRD (SUTURE) ×1 IMPLANT
SUT VIC AB 3-0 SH 27 (SUTURE) ×2
SUT VIC AB 3-0 SH 27X BRD (SUTURE) ×1 IMPLANT
SYR CONTROL 10ML LL (SYRINGE) ×2 IMPLANT
TOWEL GREEN STERILE FF (TOWEL DISPOSABLE) ×2 IMPLANT
TRAY FAXITRON CT DISP (TRAY / TRAY PROCEDURE) ×2 IMPLANT
TUBE CONNECTING 20X1/4 (TUBING) IMPLANT
YANKAUER SUCT BULB TIP NO VENT (SUCTIONS) IMPLANT

## 2021-06-09 NOTE — H&P (Signed)
?61 y.o. female who is seen today for follow up. ?She has a prior history of a right breast excisional biopsy for LCIS. She has been followed by MRI and mammography. She has an MRI in August 2022 that is negative. She had a mammogram with B density breast. There is a persistent right breast asymmetry. This underwent stereotactic biopsy and is flat epithelial atypia. She has no mass or discharge. She has no changes since her last visit except for she learned that her maternal grandmother had breast cancer at a very elderly age. She is here to discuss her options. ?  ? ?Review of Systems: ?A complete review of systems was obtained from the patient. I have reviewed this information and discussed as appropriate with the patient. See HPI as well for other ROS. ? ?Review of Systems  ?All other systems reviewed and are negative. ? ? ?Medical History: ?Past Medical History:  ?Diagnosis Date  ? Anxiety  ? Depression  ? History of motion sickness  ? Migraine  ? Urinary incontinence, mixed  ? ?Patient Active Problem List  ?Diagnosis  ? Mixed stress and urge urinary incontinence  ? Dyspareunia, female  ? Depression with anxiety  ? ?Past Surgical History:  ?Procedure Laterality Date  ? pilonidal cyst 1995  ? LAPAROSCOPIC CHOLECYSTECTOMY 2005  ? CHOLECYSTECTOMY 2005  ? REMOVAL OVARIAN CYST 2007  ?complicated by knicking abdominal aorta  ? EXPLORATORY LAPAROTOMY 2007  ?for hemorrhage at time of ovarian cyst removal  ? COLONOSCOPY 04/09/2012  ? PELVIC EXAMINATION UNDER ANESTHESIA N/A 03/13/2015  ?Procedure: PELVIC EXAMINATION UNDER ANESTHESIA ; Surgeon: Evaristo Bury, MD; [facility]; Service: Gynecology; Laterality: N/A;  ? REVISION/REMOVAL PROSTHETIC VAGINAL GRAFT N/A 03/13/2015  ?Procedure: REVISION (INCLUDING REMOVAL) OF PROSTHETIC VAGINAL GRAFT; Surgeon: Evaristo Bury, MD; Location[facility]; Service: Gynecology; Laterality: N/A;  ? CYSTOURETHROSCOPY N/A 03/13/2015  ?Procedure: CYSTOURETHROSCOPY ; Surgeon: Evaristo Bury,  MD; Location[facility]; Service: Gynecology; Laterality: N/A;  ? SLING FOR STRESS INCONTINENCE N/A 02/26/2016  ?Procedure: SLING OPERATION FOR STRESS INCONTINENCE (EG, FASCIA OR SYNTHETIC); Surgeon: Evaristo Bury, MD; Location: [place]; Service: Gynecology; Laterality: N/A;  ? CYSTOURETHROSCOPY N/A 02/26/2016  ?Procedure: CYSTOURETHROSCOPY (SEPARATE PROCEDURE); Surgeon: Evaristo Bury, MD; Location: [place]; Service: Gynecology; Laterality: N/A;  ? ARTHROTOMY KNEE WITH MENISCECTOMY MEDIAL AND LATERAL Left 10/22/2020  ? BREAST EXCISIONAL BIOPSY Right  ? SLING FOR STRESS INCONTINENCE 2011-2012  ? ? ?No Known Allergies ? ?Current Outpatient Medications on File Prior to Visit  ?Medication Sig Dispense Refill  ? famotidine (PEPCID) 40 MG tablet TAKE 1 TABLET BY MOUTH EVERY NIGHT AT BEDTIME  ? lansoprazole (PREVACID) 30 MG DR capsule Take 1 capsule (30 mg total) by mouth once daily 30 capsule 11  ? metoprolol tartrate (LOPRESSOR) 25 MG tablet  ? ? ?Family History  ?Problem Relation Age of Onset  ? Heart disease Father  ?Deceased  ? Multiple myeloma Father  ? Heart disease Paternal Uncle  ? Heart disease Paternal Uncle  ? Anesthesia problems Neg Hx  ? ? ?Social History  ? ?Tobacco Use  ?Smoking Status Never  ?Smokeless Tobacco Never  ? ? ?Social History  ? ?Socioeconomic History  ? Marital status: Married  ?Tobacco Use  ? Smoking status: Never  ? Smokeless tobacco: Never  ?Substance and Sexual Activity  ? Alcohol use: No  ? Drug use: No  ? Sexual activity: Yes  ?Partners: Male  ?Birth control/protection: None  ? ?Objective:  ? ?Vitals:  ?Body mass index is 38.67 kg/m?Marland Kitchen ? ?  Physical Exam ?Constitutional:  ?Appearance: Normal appearance.  ?Chest:  ?Breasts: ?Right: No inverted nipple, mass or nipple discharge.  ?Left: No inverted nipple, mass or nipple discharge.  ?Lymphadenopathy:  ?Upper Body:  ?Right upper body: No supraclavicular or axillary adenopathy.  ?Left upper body: No supraclavicular or axillary  adenopathy.  ?Neurological:  ?Mental Status: She is alert.  ? ? ?Assessment and Plan:  ? ? ?Flat epithelial atypia of breast, right ? ? ?Right breast radioactive seed guided excisional biopsy ? ?We discussed options including observation for FEA as well as excision. I think observation would be an option but with her higher baseline risk and the fact that this is a distortion that makes it hard to follow we discussed excision. She is agreeable to this. I am going to proceed with excision once her husband recovers from his surgery as scheduled next week. ?

## 2021-06-09 NOTE — Discharge Instructions (Addendum)
[facility] ?Office Phone Number [phone] ? ? POST OP INSTRUCTIONS ?Take 400 mg of ibuprofen every 8 hours or 650 mg tylenol every 6 hours for next 72 hours then as needed. Use ice several times daily also. ?Always review your discharge instruction sheet given to you by the facility where your surgery was performed. ? ?IF YOU HAVE DISABILITY OR FAMILY LEAVE FORMS, YOU MUST BRING THEM TO THE OFFICE FOR PROCESSING.  DO NOT GIVE THEM TO YOUR DOCTOR. ? ?you may take acetaminophen (Tylenol), naprosyn (Alleve) or ibuprofen (Advil) as needed. ?Take your usually prescribed medications unless otherwise directed ?You should eat very light the first 24 hours after surgery, such as soup, crackers, pudding, etc.  Resume your normal diet the day after surgery. ?Most patients will experience some swelling and bruising in the breast.  Ice packs and a good support bra will help.  Wear the breast binder provided or a sports bra for 72 hours day and night.  After that wear a sports bra during the day until you return to the office. Swelling and bruising can take several days to resolve.  ?It is common to experience some constipation if taking pain medication after surgery.  Increasing fluid intake and taking a stool softener will usually help or prevent this problem from occurring.  A mild laxative (Milk of Magnesia or Miralax) may also help ?I used skin glue on the incision, you may shower in 24 hours.  The glue will flake off over the next 2-3 weeks.  Any sutures or staples will be removed at the office during your follow-up visit. ?ACTIVITIES:  You may resume regular daily activities (gradually increasing) beginning the next day.  Wearing a good support bra or sports bra minimizes pain and swelling.  You may have sexual intercourse when it is comfortable. ?You may drive when you no longer are taking prescription pain medication, you can comfortably wear a seatbelt, and you can safely maneuver your car and apply  brakes. ?RETURN TO WORK:  ______________________________________________________________________________________ ?You should see your doctor in the office for a follow-up appointment approximately two weeks after your surgery.  Your doctor?s nurse will typically make your follow-up appointment when she calls you with your pathology report.  Expect your pathology report 3-4 business days after your surgery.  You may call to check if you do not hear from [place] after three days. ?OTHER INSTRUCTIONS: _______________________________________________________________________________________________ _____________________________________________________________________________________________________________________________________ ?_____________________________________________________________________________________________________________________________________ ?_____________________________________________________________________________________________________________________________________ ? ?WHEN TO CALL DR WAKEFIELD: ?Fever over 101.0 ?Nausea and/or vomiting. ?Extreme swelling or bruising. ?Continued bleeding from incision. ?Increased pain, redness, or drainage from the incision. ? ?The clinic staff is available to answer your questions during regular business hours.  Please don?t hesitate to call and ask to speak to one of the nurses for clinical concerns.  If you have a medical emergency, go to the nearest emergency room or call 911.  A surgeon from [facility] is always on call at the hospital. ? ?For further questions, please visit [email or web address] ? ? ?Post Anesthesia Home Care Instructions ? ?Activity: ?Get plenty of rest for the remainder of the day. A responsible individual must stay with you for 24 hours following the procedure.  ?For the next 24 hours, DO NOT: ?-Drive a car ?-Paediatric nurse ?-Drink alcoholic beverages ?-Take any medication unless instructed by your  physician ?-Make any legal decisions or sign important papers. ? ?Meals: ?Start with liquid foods such as gelatin or soup. Progress to regular foods as tolerated. Avoid greasy, spicy, heavy foods.  If nausea and/or vomiting occur, drink only clear liquids until the nausea and/or vomiting subsides. Call your physician if vomiting continues. ? ?Special Instructions/Symptoms: ?Your throat may feel dry or sore from the anesthesia or the breathing tube placed in your throat during surgery. If this causes discomfort, gargle with warm salt water. The discomfort should disappear within 24 hours. ? ?If you had a scopolamine patch placed behind your ear for the management of post- operative nausea and/or vomiting: ? ?1. The medication in the patch is effective for 72 hours, after which it should be removed.  Wrap patch in a tissue and discard in the trash. Wash hands thoroughly with soap and water. ?2. You may remove the patch earlier than 72 hours if you experience unpleasant side effects which may include dry mouth, dizziness or visual disturbances. ?3. Avoid touching the patch. Wash your hands with soap and water after contact with the patch. ?    ?

## 2021-06-09 NOTE — Anesthesia Preprocedure Evaluation (Addendum)
Anesthesia Evaluation  ?Patient identified by MRN, date of birth, ID band ?Patient awake ? ? ? ?Reviewed: ?Allergy & Precautions, NPO status , Patient's Chart, lab work & pertinent test results ? ?Airway ?Mallampati: II ? ?TM Distance: >3 FB ?Neck ROM: Full ? ? ? Dental ?no notable dental hx. ? ?  ?Pulmonary ?neg pulmonary ROS,  ?  ?Pulmonary exam normal ?breath sounds clear to auscultation ? ? ? ? ? ? Cardiovascular ?Exercise Tolerance: Good ?Normal cardiovascular exam+ dysrhythmias (PVC's on metoprolol)  ?Rhythm:Regular Rate:Normal ? ?Sinus rhythm with 1st degree A-V block ?Otherwise normal ECG ?When compared with ECG of 08-Jun-2021 12:35, ?PREVIOUS ECG IS PRESENT ?  ?Neuro/Psych ? Headaches, PSYCHIATRIC DISORDERS Anxiety Depression   ? GI/Hepatic ?Neg liver ROS, GERD  Medicated,  ?Endo/Other  ?obesity ? Renal/GU ?negative Renal ROS  ?negative genitourinary ?  ?Musculoskeletal ?negative musculoskeletal ROS ?(+)  ? Abdominal ?  ?Peds ?negative pediatric ROS ?(+)  Hematology ?negative hematology ROS ?(+)   ?Anesthesia Other Findings ? ? Reproductive/Obstetrics ?negative OB ROS ? ?  ? ? ? ? ? ? ? ? ? ? ? ? ? ?  ?  ? ? ? ? ? ? ? ? ?Anesthesia Physical ?Anesthesia Plan ? ?ASA: 3 ? ?Anesthesia Plan: General  ? ?Post-op Pain Management: Tylenol PO (pre-op)*  ? ?Induction: Intravenous ? ?PONV Risk Score and Plan: 3 and Midazolam, Scopolamine patch - Pre-op, Treatment may vary due to age or medical condition, Ondansetron and Dexamethasone ? ?Airway Management Planned: LMA ? ?Additional Equipment:  ? ?Intra-op Plan:  ? ?Post-operative Plan: Extubation in OR ? ?Informed Consent: I have reviewed the patients History and Physical, chart, labs and discussed the procedure including the risks, benefits and alternatives for the proposed anesthesia with the patient or authorized representative who has indicated his/her understanding and acceptance.  ? ? ? ?Dental advisory given ? ?Plan Discussed  with: CRNA, Anesthesiologist and Surgeon ? ?Anesthesia Plan Comments:   ? ? ? ? ? ? ?Anesthesia Quick Evaluation ? ?

## 2021-06-09 NOTE — Anesthesia Postprocedure Evaluation (Signed)
Anesthesia Post Note ? ?Patient: [name] ? ?Procedure(s) Performed: RADIOACTIVE SEED GUIDED EXCISIONAL RIGHT BREAST BIOPSY (Right: Breast) ? ?  ? ?Patient location during evaluation: PACU ?Anesthesia Type: General ?Level of consciousness: awake ?Pain management: pain level controlled ?Vital Signs Assessment: post-procedure vital signs reviewed and stable ?Respiratory status: spontaneous breathing and respiratory function stable ?Cardiovascular status: stable ?Postop Assessment: no apparent nausea or vomiting ?Anesthetic complications: no ? ? ?No notable events documented. ? ?Last Vitals:  ?Vitals:  ? 06/09/21 1200 06/09/21 1213  ?BP:  127/79  ?Pulse: 67 74  ?Resp: 14 18  ?Temp:  36.7 ?C  ?SpO2: 97% 98%  ?  ?Last Pain:  ?Vitals:  ? 06/09/21 1217  ?TempSrc:   ?PainSc: 4   ? ? ?  ?  ?  ?  ?  ?  ? ?Merlinda Frederick ? ? ? ? ?

## 2021-06-09 NOTE — Transfer of Care (Signed)
Immediate Anesthesia Transfer of Care Note ? ?Patient: [name] ? ?Procedure(s) Performed: RADIOACTIVE SEED GUIDED EXCISIONAL RIGHT BREAST BIOPSY (Right: Breast) ? ?Patient Location: PACU ? ?Anesthesia Type:General ? ?Level of Consciousness: drowsy and patient cooperative ? ?Airway & Oxygen Therapy: Patient Spontanous Breathing and Patient connected to face mask oxygen ? ?Post-op Assessment: Report given to RN and Post -op Vital signs reviewed and stable ? ?Post vital signs: Reviewed and stable ? ?Last Vitals:  ?Vitals Value Taken Time  ?BP    ?Temp    ?Pulse 76 06/09/21 1129  ?Resp 16 06/09/21 1129  ?SpO2 100 % 06/09/21 1129  ?Vitals shown include unvalidated device data. ? ?Last Pain:  ?Vitals:  ? 06/09/21 0956  ?TempSrc: Oral  ?PainSc: 0-No pain  ?   ? ?Patients Stated Pain Goal: 4 (06/09/21 2353) ? ?Complications: No notable events documented. ?

## 2021-06-09 NOTE — Anesthesia Procedure Notes (Signed)
Procedure Name: LMA Insertion ?Date/Time: 06/09/2021 10:46 AM ?Performed by: Signe Colt, CRNA ?Pre-anesthesia Checklist: Patient identified, Emergency Drugs available, Suction available and Patient being monitored ?Patient Re-evaluated:Patient Re-evaluated prior to induction ?Oxygen Delivery Method: Circle System Utilized ?Preoxygenation: Pre-oxygenation with 100% oxygen ?Induction Type: IV induction ?Ventilation: Mask ventilation without difficulty ?LMA: LMA inserted ?LMA Size: 4.0 ?Number of attempts: 1 ?Airway Equipment and Method: bite block ?Placement Confirmation: positive ETCO2 ?Tube secured with: Tape ?Dental Injury: Teeth and Oropharynx as per pre-operative assessment  ? ? ? ? ?

## 2021-06-09 NOTE — Op Note (Signed)
Preoperative diagnosis: Right breast cancer with core biopsy of FEA, high risk for breast cancer ?Postoperative diagnosis: Same as above ?Procedure: Right breast seed guided excisional biopsy ?Surgeon: Dr. Serita Grammes ?Estimated blood loss: Minimal ?Specimens: Right breast tissue marked with paint containing seed and clip ?Complications: None ?Drains: None ?Sponge needle count was correct x2 at end of operation ?Disposition to recovery stable condition ? ?Indications: 61 y.o. female with a prior history of a right breast excisional biopsy for LCIS. She has been followed by MRI and mammography. She has an MRI in August 2022 that is negative. She had a mammogram with B density breast. There is a persistent right breast asymmetry. This underwent stereotactic biopsy and is flat epithelial atypia. She has no mass or discharge. She has no changes since her last visit except for she learned that her maternal grandmother had breast cancer at a very elderly age. She is here to discuss her options. Due to her high risk nature we discussed options and elected to proceed with excision.  ?  ?Procedure: After informed consent was obtained the patient was taken to the operating room.  She was placed under anesthesia.  She had been given antibiotics and SCDs were in place.  She was prepped and draped in the standard sterile surgical fashion.  A surgical timeout was then performed. ? ?I located the seed in the lateral right breast.  I infiltrated marcaine and then made a periareolar incision in order to hide the scar later.  then used the neoprobe to dissect to the seed.  I removed the seed and the surrounding tissue.  I then did a mammogram confirming removal of the seed and the clip.  Hemostasis was obtained.  I mobilized the breast tissue and closed with 2-0 Vicryl.  The skin was closed with 3-0 Vicryl and 5-0 Monocryl.  Glue Steri-Strips were applied.  She tolerated this well was extubated and transferred recovery in stable  condition. ?

## 2021-06-09 NOTE — Interval H&P Note (Signed)
History and Physical Interval Note: ? ?06/09/2021 ?10:23 AM ? ?[name]  has presented today for surgery, with the diagnosis of RIGHT BREAST DISTORTION.  The various methods of treatment have been discussed with the patient and family. After consideration of risks, benefits and other options for treatment, the patient has consented to  Procedure(s): ?RADIOACTIVE SEED GUIDED EXCISIONAL RIGHT BREAST BIOPSY (Right) as a surgical intervention.  The patient's history has been reviewed, patient examined, no change in status, stable for surgery.  I have reviewed the patient's chart and labs.  Questions were answered to the patient's satisfaction.   ? ? ?Rolm Bookbinder ? ? ?

## 2021-06-10 ENCOUNTER — Encounter (HOSPITAL_BASED_OUTPATIENT_CLINIC_OR_DEPARTMENT_OTHER): Payer: Self-pay | Admitting: General Surgery

## 2021-06-10 LAB — SURGICAL PATHOLOGY

## 2021-06-16 ENCOUNTER — Encounter (HOSPITAL_COMMUNITY): Payer: Self-pay

## 2021-08-19 ENCOUNTER — Other Ambulatory Visit: Payer: Self-pay | Admitting: General Surgery

## 2021-08-19 DIAGNOSIS — Z1239 Encounter for other screening for malignant neoplasm of breast: Secondary | ICD-10-CM

## 2021-09-09 ENCOUNTER — Ambulatory Visit: Payer: Federal, State, Local not specified - PPO | Admitting: Cardiology

## 2021-09-09 ENCOUNTER — Encounter: Payer: Self-pay | Admitting: Cardiology

## 2021-09-09 VITALS — BP 110/80 | HR 74 | Ht 66.0 in | Wt 231.2 lb

## 2021-09-09 DIAGNOSIS — R002 Palpitations: Secondary | ICD-10-CM

## 2021-09-09 DIAGNOSIS — I493 Ventricular premature depolarization: Secondary | ICD-10-CM | POA: Diagnosis not present

## 2021-09-09 DIAGNOSIS — R079 Chest pain, unspecified: Secondary | ICD-10-CM

## 2021-09-09 LAB — LIPID PANEL
Chol/HDL Ratio: 3.8 ratio (ref 0.0–4.4)
Cholesterol, Total: 171 mg/dL (ref 100–199)
HDL: 45 mg/dL (ref >39)
LDL Chol Calc (NIH): 111 mg/dL — ABNORMAL HIGH (ref 0–99)
Triglycerides: 80 mg/dL (ref 0–149)
VLDL Cholesterol Cal: 15 mg/dL (ref 5–40)

## 2021-09-09 NOTE — Progress Notes (Signed)
Cardiology Office Note:    Date:  09/09/2021   ID:  [name], DOB June 09, 1960, MRN [id number]  PCP:  Angelina Sheriff, MD  Cardiologist:  Jenne Campus, MD    Referring MD: Angelina Sheriff, MD   Chief Complaint  Patient presents with   Follow-up  Doing well palpitations of fine  History of Present Illness:    [name] is a 61 y.o. female with past medical history significant for PVCs noted on the monitor, successfully suppressed with small dose of beta-blocker, dyspnea on exertion normal echocardiogram, obesity. Comes today 2 months of follow-up overall doing well  Palpitations with done better her much.  EKG today did not show any PVCs.  Denies have any chest pain tightness squeezing pressure burning chest overall doing doing well  Past Medical History:  Diagnosis Date   GERD (gastroesophageal reflux disease)    Insomnia    Migraine headache    Overactive bladder    PVC's (premature ventricular contractions)    on metoprolol    Past Surgical History:  Procedure Laterality Date   BREAST EXCISIONAL BIOPSY Right 04/11/2019   LCIS   EXPLORATORY LAPAROTOMY LEFT CYSTECTOMY     DERMOID ON LEFT   LAPAROSCOPIC CHLOECYSTECTOMY  2005   LEEP [place]     MOLE REMOVAL     PRECANCEROUS   PILONIDIAL CYST  1998   RADIOACTIVE SEED GUIDED EXCISIONAL BREAST BIOPSY Right 04/11/2019   Procedure: RADIOACTIVE SEED GUIDED EXCISIONAL RIGHT BREAST BIOPSY;  Surgeon: Rolm Bookbinder, MD;  Location: [place];  Service: General;  Laterality: Right;   RADIOACTIVE SEED GUIDED EXCISIONAL BREAST BIOPSY Right 06/09/2021   Procedure: RADIOACTIVE SEED GUIDED EXCISIONAL RIGHT BREAST BIOPSY;  Surgeon: Rolm Bookbinder, MD;  Location: [place];  Service: General;  Laterality: Right;   SUBURETHRAL SLING  11/30/2009   SOLYX    Current Medications: Current Meds  Medication Sig   aspirin EC 81 MG tablet Take 81 mg by mouth daily. Swallow whole.    cholecalciferol (VITAMIN D3) 25 MCG (1000 UNIT) tablet Take 1,000 Units by mouth daily.   famotidine (PEPCID) 20 MG tablet Take 20 mg by mouth daily.   lansoprazole (PREVACID) 30 MG capsule Take 30 mg by mouth daily at 12 noon.   metoprolol succinate (TOPROL-XL) 25 MG 24 hr tablet TAKE 1 TABLET BY MOUTH DAILY WITH OR IMMEDIATELY FOLLOWING A MEAL (Patient taking differently: Take 25 mg by mouth daily.)   Turmeric 450 MG CAPS Take 1 tablet by mouth daily.   vitamin B-12 (CYANOCOBALAMIN) 1000 MCG tablet Take 1,000 mcg by mouth daily.   vitamin C (ASCORBIC ACID) 500 MG tablet Take 500 mg by mouth daily.   zinc gluconate 50 MG tablet Take 50 mg by mouth daily.     Allergies:   Patient has no known allergies.   Social History   Socioeconomic History   Marital status: Married    Spouse name: Not on file   Number of children: Not on file   Years of education: Not on file   Highest education level: Not on file  Occupational History   Not on file  Tobacco Use   Smoking status: Never   Smokeless tobacco: Never  Vaping Use   Vaping Use: Never used  Substance and Sexual Activity   Alcohol use: Not Currently    Comment: rarely   Drug use: Never   Sexual activity: Yes    Birth control/protection: None  Comment: states periods are very irreg and spotty  Other Topics Concern   Not on file  Social History Narrative   Not on file   Social Determinants of Health   Financial Resource Strain: Not on file  Food Insecurity: Not on file  Transportation Needs: Not on file  Physical Activity: Not on file  Stress: Not on file  Social Connections: Not on file     Family History: The patient's family history includes Breast cancer in her maternal grandmother; Heart disease in her father. ROS:   Please see the history of present illness.    All 14 point review of systems negative except as described per history of present illness  EKGs/Labs/Other Studies Reviewed:      Recent Labs: No  results found for requested labs within last 365 days.  Recent Lipid Panel    Component Value Date/Time   CHOL 165 10/05/2011 1513    Physical Exam:    VS:  BP 110/80 (BP Location: Left Arm, Patient Position: Sitting)   Pulse 74   Ht '5\' 6"'$  (1.676 m)   Wt 231 lb 3.2 oz (104.9 kg)   SpO2 96%   BMI 37.32 kg/m     Wt Readings from Last 3 Encounters:  09/09/21 231 lb 3.2 oz (104.9 kg)  06/09/21 238 lb 1.6 oz (108 kg)  07/16/20 242 lb 12.8 oz (110.1 kg)     GEN:  Well nourished, well developed in no acute distress HEENT: Normal NECK: No JVD; No carotid bruits LYMPHATICS: No lymphadenopathy CARDIAC: RRR, no murmurs, no rubs, no gallops RESPIRATORY:  Clear to auscultation without rales, wheezing or rhonchi  ABDOMEN: Soft, non-tender, non-distended MUSCULOSKELETAL:  No edema; No deformity  SKIN: Warm and dry LOWER EXTREMITIES: no swelling NEUROLOGIC:  Alert and oriented x 3 PSYCHIATRIC:  Normal affect   ASSESSMENT:    1. Chest pain, unspecified type   2. Premature ventricular beats   3. Palpitations    PLAN:    In order of problems listed above:  PVCs successfully suppressed with beta-blocker I continue.  We had a long discussion and I strongly suggested to continue. Dyslipidemia I do have her fasting lipid performed 2021 with total cholesterol 200 HDL 48 this is reviewing of K PN.  I will ask her to have fasting lipid profile today. Palpitations controlled.   Medication Adjustments/Labs and Tests Ordered: Current medicines are reviewed at length with the patient today.  Concerns regarding medicines are outlined above.  Orders Placed This Encounter  Procedures   EKG 12-Lead   Medication changes: No orders of the defined types were placed in this encounter.   Signed, Park Liter, MD, [facility] 09/09/2021 9:59 AM    [place]

## 2021-09-09 NOTE — Patient Instructions (Signed)
Medication Instructions:  Your physician recommends that you continue on your current medications as directed. Please refer to the Current Medication list given to you today.  *If you need a refill on your cardiac medications before your next appointment, please call your pharmacy*   Lab Work: Your physician recommends that you return for lab work in:   Labs today: Lipids  If you have labs (blood work) drawn today and your tests are completely normal, you will receive your results only by: [place] (if you have [place]) OR A paper copy in the mail If you have any lab test that is abnormal or we need to change your treatment, we will call you to review the results.   Testing/Procedures: None   Follow-Up: At [facility], you and your health needs are our priority.  As part of our continuing mission to provide you with exceptional heart care, we have created designated Provider Care Teams.  These Care Teams include your primary Cardiologist (physician) and Advanced Practice Providers (APPs -  Physician Assistants and Nurse Practitioners) who all work together to provide you with the care you need, when you need it.  We recommend signing up for the patient portal called "MyChart".  Sign up information is provided on this After Visit Summary.  MyChart is used to connect with patients for Virtual Visits (Telemedicine).  Patients are able to view lab/test results, encounter notes, upcoming appointments, etc.  Non-urgent messages can be sent to your provider as well.   To learn more about what you can do with MyChart, go to [email or web address].    Your next appointment:   1 year(s)  The format for your next appointment:   In Person  Provider:   Jenne Campus, MD    Other Instructions None  Important Information About Sugar

## 2021-09-09 NOTE — Addendum Note (Signed)
Addended by: Edwyna Shell I on: 09/09/2021 10:07 AM   Modules accepted: Orders

## 2021-09-10 ENCOUNTER — Ambulatory Visit
Admission: RE | Admit: 2021-09-10 | Discharge: 2021-09-10 | Disposition: A | Discharge status: Home / Self Care | Payer: Federal, State, Local not specified - PPO | Source: Ambulatory Visit | Attending: General Surgery | Admitting: General Surgery

## 2021-09-10 DIAGNOSIS — Z1239 Encounter for other screening for malignant neoplasm of breast: Secondary | ICD-10-CM | POA: Diagnosis not present

## 2021-09-10 MED ORDER — GADOBUTROL 1 MMOL/ML IV SOLN
10.0000 mL | Freq: Once | INTRAVENOUS | Status: AC | PRN
  Administered 2021-09-10: 10 mL via INTRAVENOUS

## 2021-09-15 ENCOUNTER — Telehealth: Payer: Self-pay

## 2021-09-15 NOTE — Telephone Encounter (Signed)
LVM per DPR- as per Dr. Wendy Poet note. Encouraged pt to call with any questions or concerns.  Routed to PCP

## 2021-09-28 DIAGNOSIS — K648 Other hemorrhoids: Secondary | ICD-10-CM | POA: Diagnosis not present

## 2021-10-06 ENCOUNTER — Other Ambulatory Visit: Payer: Self-pay | Admitting: Cardiology

## 2021-11-17 DIAGNOSIS — D0500 Lobular carcinoma in situ of unspecified breast: Secondary | ICD-10-CM | POA: Diagnosis not present

## 2021-11-22 DIAGNOSIS — Z0100 Encounter for examination of eyes and vision without abnormal findings: Secondary | ICD-10-CM | POA: Diagnosis not present

## 2022-01-27 ENCOUNTER — Other Ambulatory Visit: Payer: Self-pay | Admitting: Obstetrics and Gynecology

## 2022-01-27 DIAGNOSIS — Z1231 Encounter for screening mammogram for malignant neoplasm of breast: Secondary | ICD-10-CM

## 2022-03-21 ENCOUNTER — Ambulatory Visit
Admission: RE | Admit: 2022-03-21 | Discharge: 2022-03-21 | Disposition: A | Discharge status: Home / Self Care | Payer: Federal, State, Local not specified - PPO | Source: Ambulatory Visit | Attending: Obstetrics and Gynecology | Admitting: Obstetrics and Gynecology

## 2022-03-21 DIAGNOSIS — Z1231 Encounter for screening mammogram for malignant neoplasm of breast: Secondary | ICD-10-CM

## 2022-10-28 ENCOUNTER — Other Ambulatory Visit: Payer: Self-pay | Admitting: Cardiology

## 2022-11-18 ENCOUNTER — Ambulatory Visit: Payer: Federal, State, Local not specified - PPO | Admitting: Cardiology

## 2023-01-10 ENCOUNTER — Ambulatory Visit: Payer: Federal, State, Local not specified - PPO | Attending: Cardiology | Admitting: Cardiology

## 2023-01-10 ENCOUNTER — Encounter: Payer: Self-pay | Admitting: Cardiology

## 2023-01-10 VITALS — BP 110/76 | HR 72 | Ht 66.0 in | Wt 234.2 lb

## 2023-01-10 DIAGNOSIS — R079 Chest pain, unspecified: Secondary | ICD-10-CM

## 2023-01-10 DIAGNOSIS — E669 Obesity, unspecified: Secondary | ICD-10-CM | POA: Diagnosis not present

## 2023-01-10 DIAGNOSIS — R002 Palpitations: Secondary | ICD-10-CM

## 2023-01-10 DIAGNOSIS — I493 Ventricular premature depolarization: Secondary | ICD-10-CM | POA: Diagnosis not present

## 2023-01-10 NOTE — Progress Notes (Signed)
Cardiology Office Note:    Date:  01/10/2023   ID:  [name], DOB 20-Mar-1960, MRN [id number]  PCP:  Annamaria Helling, DO  Cardiologist:  Gypsy Balsam, MD    Referring MD: Noni Saupe, MD   Chief Complaint  Patient presents with   Follow-up    History of Present Illness:    [name] is a 62 y.o. female with past medical history significant for palpitations seen for PVCs.  Quite extensive workup has been performed which show no worrisome findings.  Overall doing well denies of any chest pain tightness squeezing pressure burning chest no palpitation dizziness swelling of lower extremities  Past Medical History:  Diagnosis Date   GERD (gastroesophageal reflux disease)    Insomnia    Migraine headache    Overactive bladder    PVC's (premature ventricular contractions)    on metoprolol    Past Surgical History:  Procedure Laterality Date   BREAST EXCISIONAL BIOPSY Right 04/11/2019   LCIS   EXPLORATORY LAPAROTOMY LEFT CYSTECTOMY     DERMOID ON LEFT   LAPAROSCOPIC CHLOECYSTECTOMY  2005   LEEP CONE     MOLE REMOVAL     PRECANCEROUS   PILONIDIAL CYST  1998   RADIOACTIVE SEED GUIDED EXCISIONAL BREAST BIOPSY Right 04/11/2019   Procedure: RADIOACTIVE SEED GUIDED EXCISIONAL RIGHT BREAST BIOPSY;  Surgeon: Emelia Loron, MD;  Location: [facility];  Service: General;  Laterality: Right;   RADIOACTIVE SEED GUIDED EXCISIONAL BREAST BIOPSY Right 06/09/2021   Procedure: RADIOACTIVE SEED GUIDED EXCISIONAL RIGHT BREAST BIOPSY;  Surgeon: Emelia Loron, MD;  Location: [facility];  Service: General;  Laterality: Right;   SUBURETHRAL SLING  11/30/2009   SOLYX    Current Medications: Current Meds  Medication Sig   aspirin EC 81 MG tablet Take 81 mg by mouth daily. Swallow whole.   Coenzyme Q10 (CO Q-10 PO) Take 300 mg by mouth daily.   KRILL OIL PO Take 500 mg by mouth daily.   metoprolol succinate (TOPROL-XL) 25 MG 24 hr  tablet TAKE 1 TABLET BY MOUTH DAILY WITH OR IMMEDIATELY FOLLOWING A MEAL (Patient taking differently: Take 25 mg by mouth daily.)   Multiple Vitamin (MULTIVITAMIN ADULT PO) Take 1 tablet by mouth daily.   OVER THE COUNTER MEDICATION Take 1 tablet by mouth daily. Veggies and Fruit Tab Natrol   zinc gluconate 50 MG tablet Take 50 mg by mouth daily.   [DISCONTINUED] cholecalciferol (VITAMIN D3) 25 MCG (1000 UNIT) tablet Take 1,000 Units by mouth daily.   [DISCONTINUED] famotidine (PEPCID) 20 MG tablet Take 20 mg by mouth daily.   [DISCONTINUED] lansoprazole (PREVACID) 30 MG capsule Take 30 mg by mouth daily at 12 noon.   [DISCONTINUED] Turmeric 450 MG CAPS Take 1 tablet by mouth daily.   [DISCONTINUED] vitamin B-12 (CYANOCOBALAMIN) 1000 MCG tablet Take 1,000 mcg by mouth daily.   [DISCONTINUED] vitamin C (ASCORBIC ACID) 500 MG tablet Take 500 mg by mouth daily.     Allergies:   Patient has no known allergies.   Social History   Socioeconomic History   Marital status: Married    Spouse name: Not on file   Number of children: Not on file   Years of education: Not on file   Highest education level: Not on file  Occupational History   Not on file  Tobacco Use   Smoking status: Never   Smokeless tobacco: Never  Vaping Use   Vaping status: Never Used  Substance and Sexual Activity   Alcohol use: Not Currently    Comment: rarely   Drug use: Never   Sexual activity: Yes    Birth control/protection: None    Comment: states periods are very irreg and spotty  Other Topics Concern   Not on file  Social History Narrative   Not on file   Social Determinants of Health   Financial Resource Strain: Not on file  Food Insecurity: No Food Insecurity (05/16/2022)   Received from [facility]   Hunger Vital Sign    Worried About Running Out of Food in the Last Year: Never true    Ran Out of Food in the Last Year: Never true  Transportation Needs: No Transportation Needs  (05/16/2022)   Received from [facility] - Transportation    In the past 12 months, has lack of transportation kept you from medical appointments or from getting medications?: No    Lack of Transportation (Non-Medical): No  Physical Activity: Not on file  Stress: Not on file  Social Connections: Not on file     Family History: The patient's family history includes Breast cancer in her maternal grandmother; Heart disease in her father. ROS:   Please see the history of present illness.    All 14 point review of systems negative except as described per history of present illness  EKGs/Labs/Other Studies Reviewed:    EKG Interpretation Date/Time:  Tuesday January 10 2023 15:45:10 EDT Ventricular Rate:  72 PR Interval:  200 QRS Duration:  84 QT Interval:  362 QTC Calculation: 396 R Axis:   0  Text Interpretation: Normal sinus rhythm Minimal voltage criteria for LVH, may be normal variant Borderline ECG When compared with ECG of 08-Jun-2021 12:41, No significant change was found Confirmed by Gypsy Balsam [phone]) on 01/10/2023 3:52:14 PM    Recent Labs: No results found for requested labs within last 365 days.  Recent Lipid Panel    Component Value Date/Time   CHOL 171 09/09/2021 1011   TRIG 80 09/09/2021 1011   HDL 45 09/09/2021 1011   CHOLHDL 3.8 09/09/2021 1011   LDLCALC 111 (H) 09/09/2021 1011    Physical Exam:    VS:  BP 110/76 (BP Location: Left Arm, Patient Position: Sitting)   Pulse 72   Ht 5\' 6"  (1.676 m)   Wt 234 lb 3.2 oz (106.2 kg)   SpO2 93%   BMI 37.80 kg/m     Wt Readings from Last 3 Encounters:  01/10/23 234 lb 3.2 oz (106.2 kg)  09/09/21 231 lb 3.2 oz (104.9 kg)  06/09/21 238 lb 1.6 oz (108 kg)     GEN:  Well nourished, well developed in no acute distress HEENT: Normal NECK: No JVD; No carotid bruits LYMPHATICS: No lymphadenopathy CARDIAC: RRR, no murmurs, no rubs, no gallops RESPIRATORY:  Clear to auscultation  without rales, wheezing or rhonchi  ABDOMEN: Soft, non-tender, non-distended MUSCULOSKELETAL:  No edema; No deformity  SKIN: Warm and dry LOWER EXTREMITIES: no swelling NEUROLOGIC:  Alert and oriented x 3 PSYCHIATRIC:  Normal affect   ASSESSMENT:    1. Chest pain, unspecified type   2. Premature ventricular beats   3. Palpitations   4. Obesity (BMI 30-39.9)    PLAN:    In order of problems listed above:  Palpitations, under control with small dose of beta-blocker which I continue. I did review cholesterol which showed total cholesterol of 171 LDL 111 HDL 45 will continue  risk modifications. Overall she is doing well continue present management see her back in 1 year   Medication Adjustments/Labs and Tests Ordered: Current medicines are reviewed at length with the patient today.  Concerns regarding medicines are outlined above.  Orders Placed This Encounter  Procedures   EKG 12-Lead   Medication changes: No orders of the defined types were placed in this encounter.   Signed, Georgeanna Lea, MD, [facility] 01/10/2023 3:59 PM    [facility]

## 2023-01-10 NOTE — Patient Instructions (Signed)

## 2023-02-06 ENCOUNTER — Other Ambulatory Visit: Payer: Self-pay | Admitting: Obstetrics and Gynecology

## 2023-02-06 DIAGNOSIS — Z1231 Encounter for screening mammogram for malignant neoplasm of breast: Secondary | ICD-10-CM

## 2023-03-23 ENCOUNTER — Ambulatory Visit
Admission: RE | Admit: 2023-03-23 | Discharge: 2023-03-23 | Disposition: A | Discharge status: Home / Self Care | Payer: BC Managed Care – PPO | Source: Ambulatory Visit | Attending: Obstetrics and Gynecology | Admitting: Obstetrics and Gynecology

## 2023-03-23 DIAGNOSIS — Z1231 Encounter for screening mammogram for malignant neoplasm of breast: Secondary | ICD-10-CM

## 2023-07-02 IMAGING — MG MM PLC BREAST LOC DEV 1ST LESION INC*R*
7 series · 7 of 7 positions shown · non-contrast
Comparison: Previous exam(s).

CLINICAL DATA: Patient is scheduled for surgical excision for the
RIGHT breast requiring preoperative radioactive seed localization.

EXAM:
MAMMOGRAPHIC GUIDED RADIOACTIVE SEED LOCALIZATION OF THE RIGHT
BREAST

[R LM (1 of 3)]
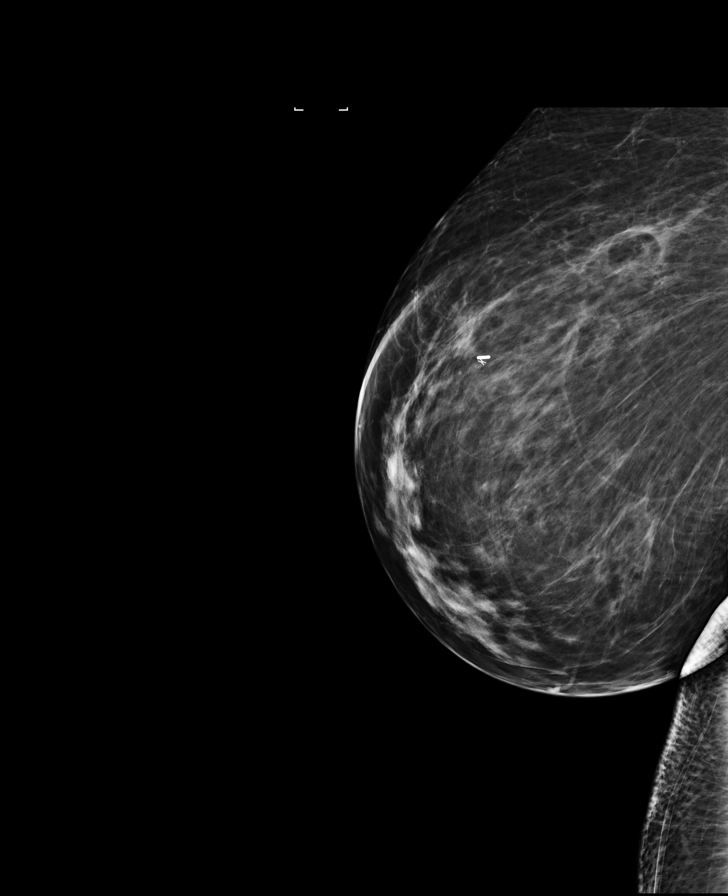

[R CC (1 of 4)]
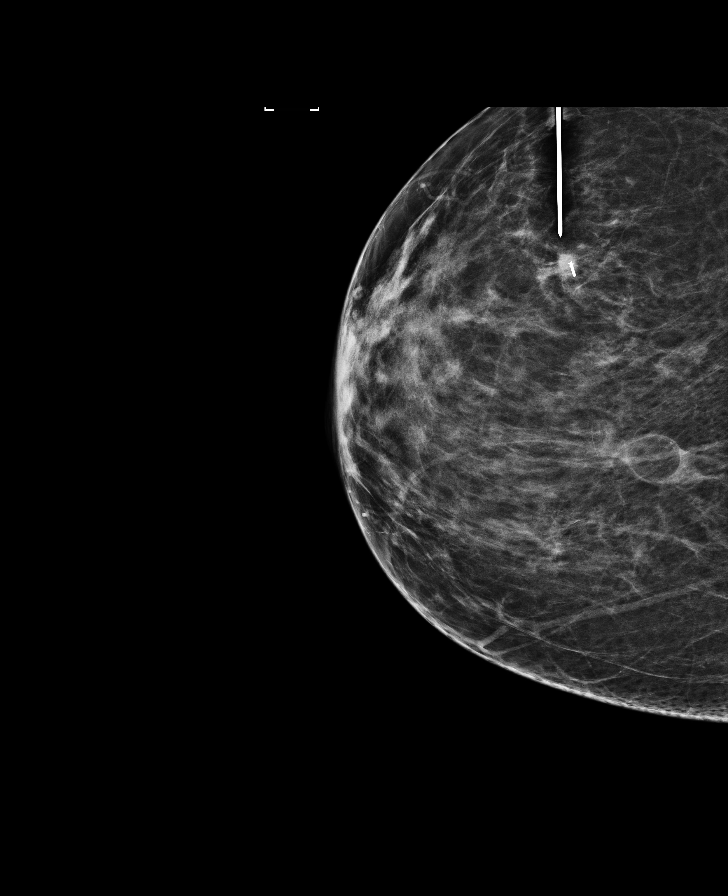

[R CC (2 of 4)]
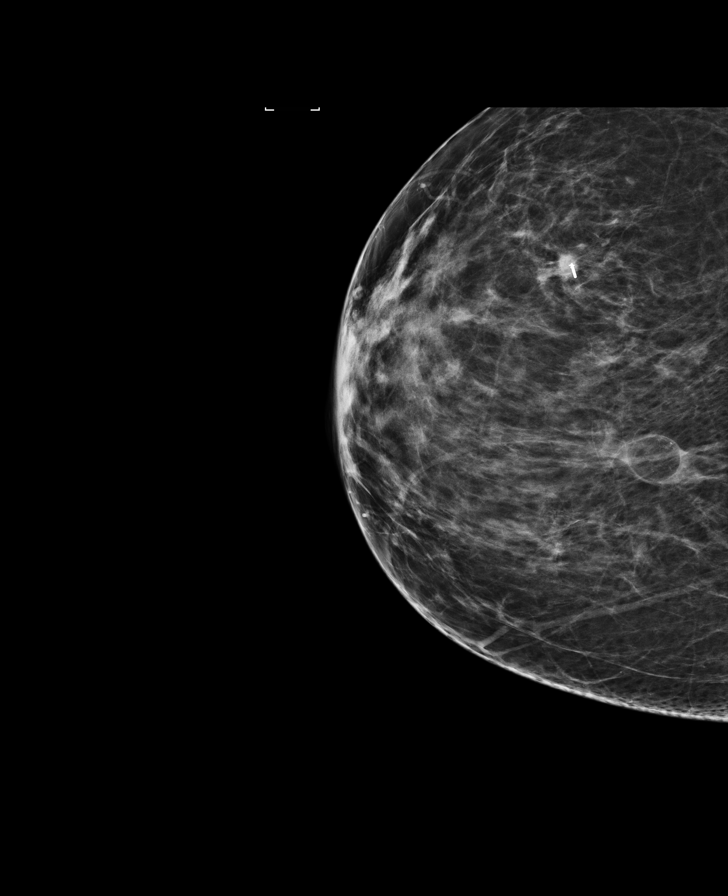

[R CC (3 of 4)]
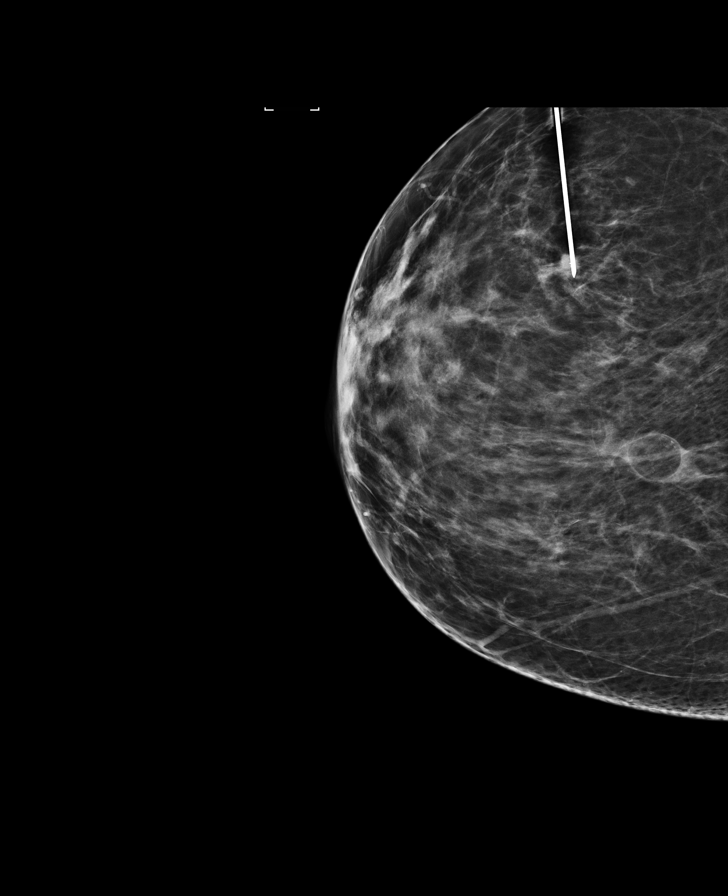

[R LM (2 of 3)]
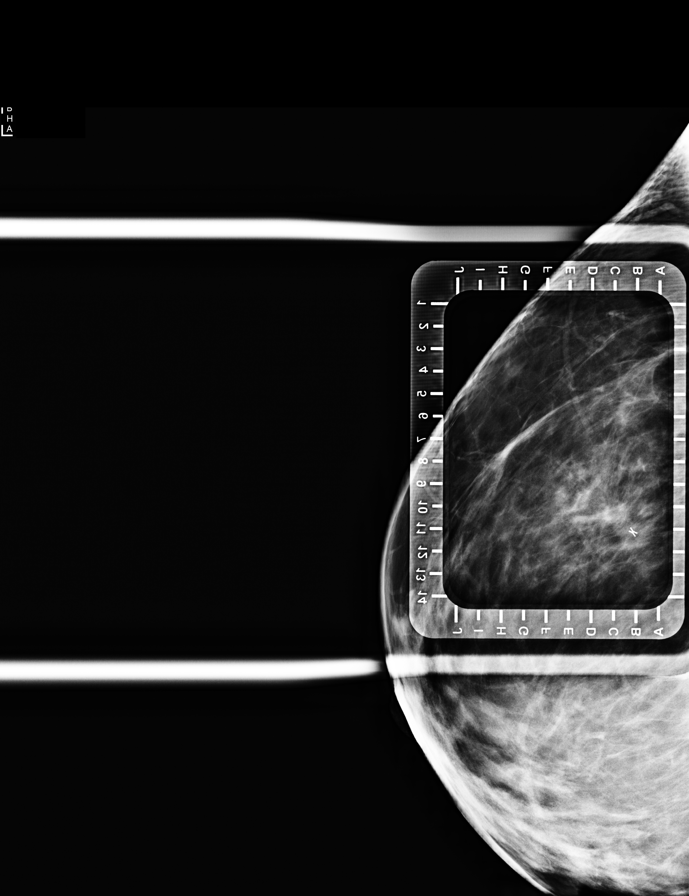

[R CC (4 of 4)]
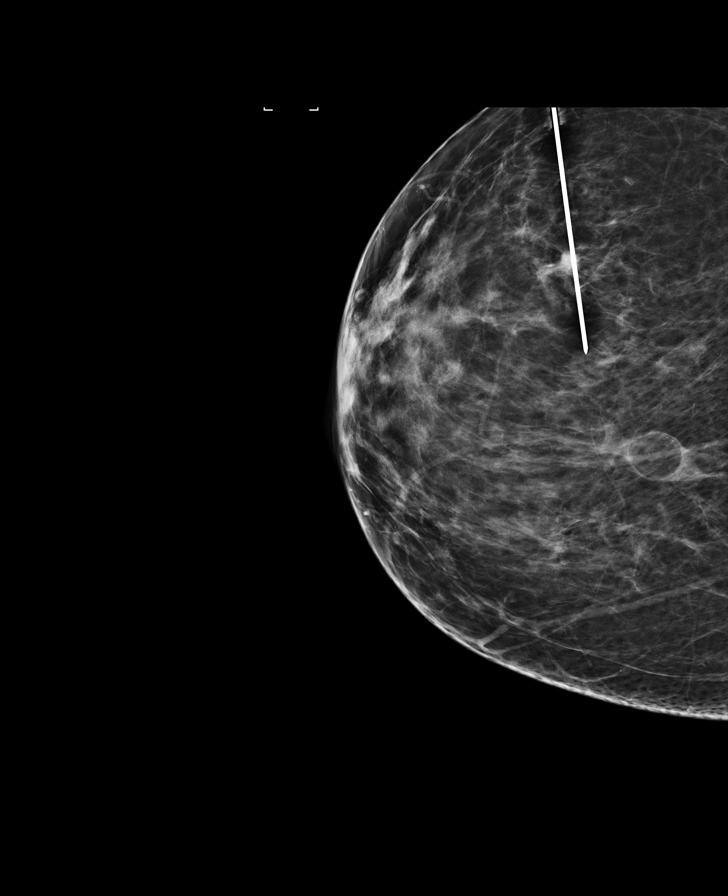

[R LM (3 of 3)]
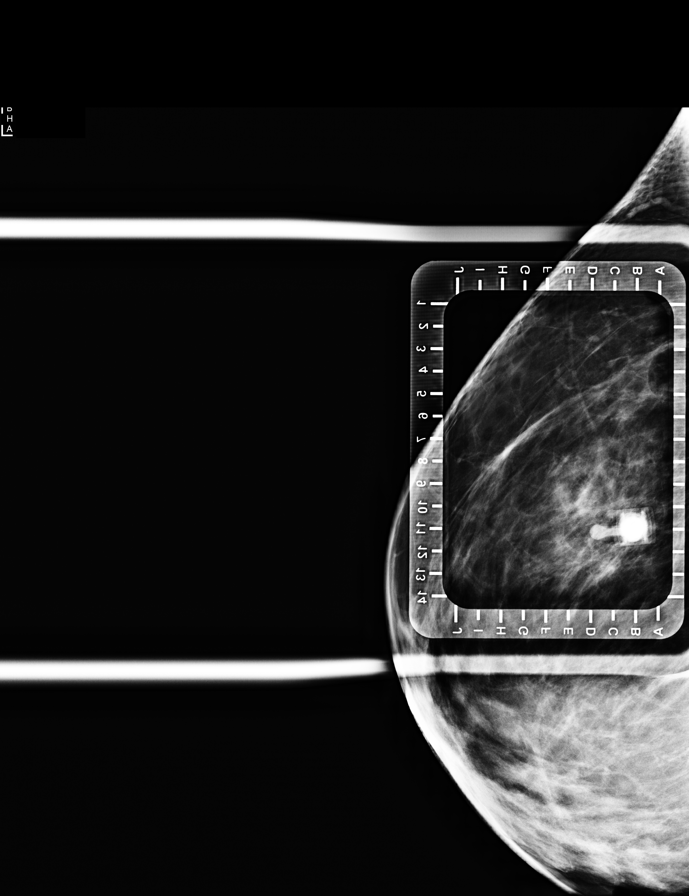

[7 of 7 positions shown; findings below may reference images not displayed]

FINDINGS: Patient presents for radioactive seed localization prior to surgical
excision. I met with the patient and we discussed the procedure of
seed localization including benefits and alternatives. We discussed
the high likelihood of a successful procedure. We discussed the
risks of the procedure including infection, bleeding, tissue injury
and further surgery. We discussed the low dose of radioactivity
involved in the procedure. Informed, written consent was given.

The usual time-out protocol was performed immediately prior to the
procedure.

Using mammographic guidance, sterile technique, 1% lidocaine and an
3-CJ5 radioactive seed, the X shaped clip within the outer RIGHT
breast was localized using a lateral approach. The follow-up
mammogram images confirm the seed in the expected location and were
marked for Dr. Suomalainen.

Follow-up survey of the patient confirms presence of the radioactive
seed.

Order number of 3-[id number] seed:  [id number].

Total activity:  0.239 millicuries reference Date: 05/20/2021

The patient tolerated the procedure well and was released from the
[REDACTED]. She was given instructions regarding seed removal.
IMPRESSION: Radioactive seed localization right breast. No apparent
complications.

## 2023-07-03 IMAGING — MG MM BREAST SURGICAL SPECIMEN
2 series · 4 of 4 positions shown · non-contrast
Comparison: Previous exam(s).

CLINICAL DATA: Status post excisional biopsy of the right breast.

EXAM:
SPECIMEN RADIOGRAPH OF THE RIGHT BREAST

[Series 1: R · right · 0.07mm/px · 2 of 2 slices shown (1 of 2)]
[im 1/2]
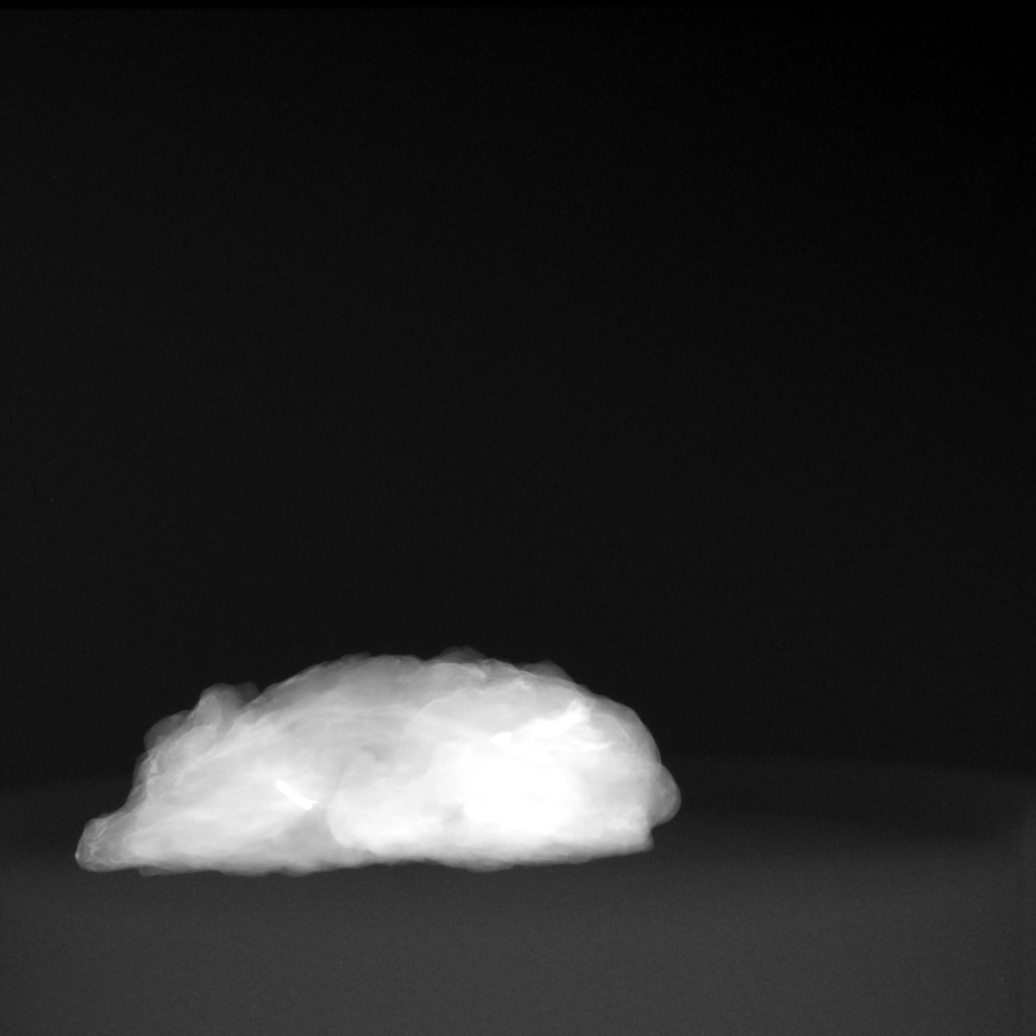
[im 2/2]
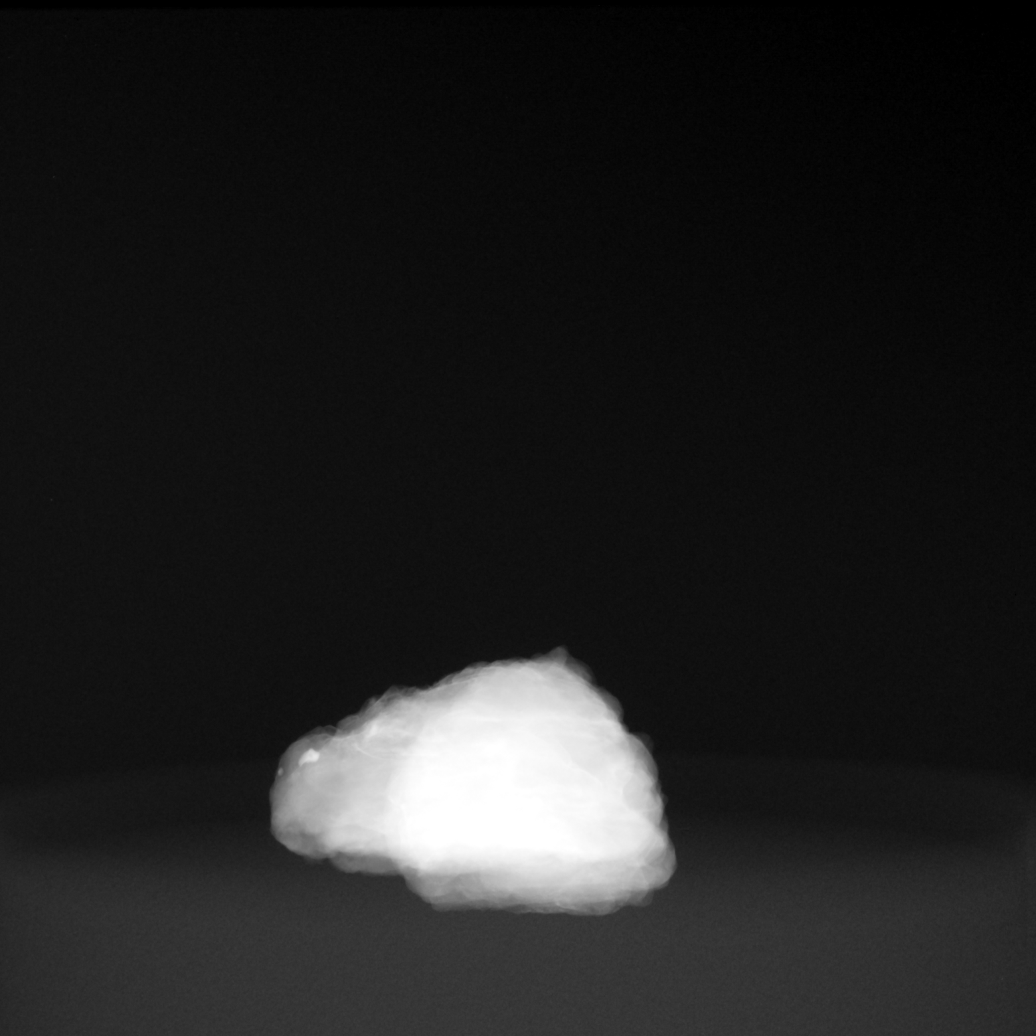

[Series 2: R · right · 0.07mm/px · 2 of 2 slices shown (2 of 2)]
[im 1/2]
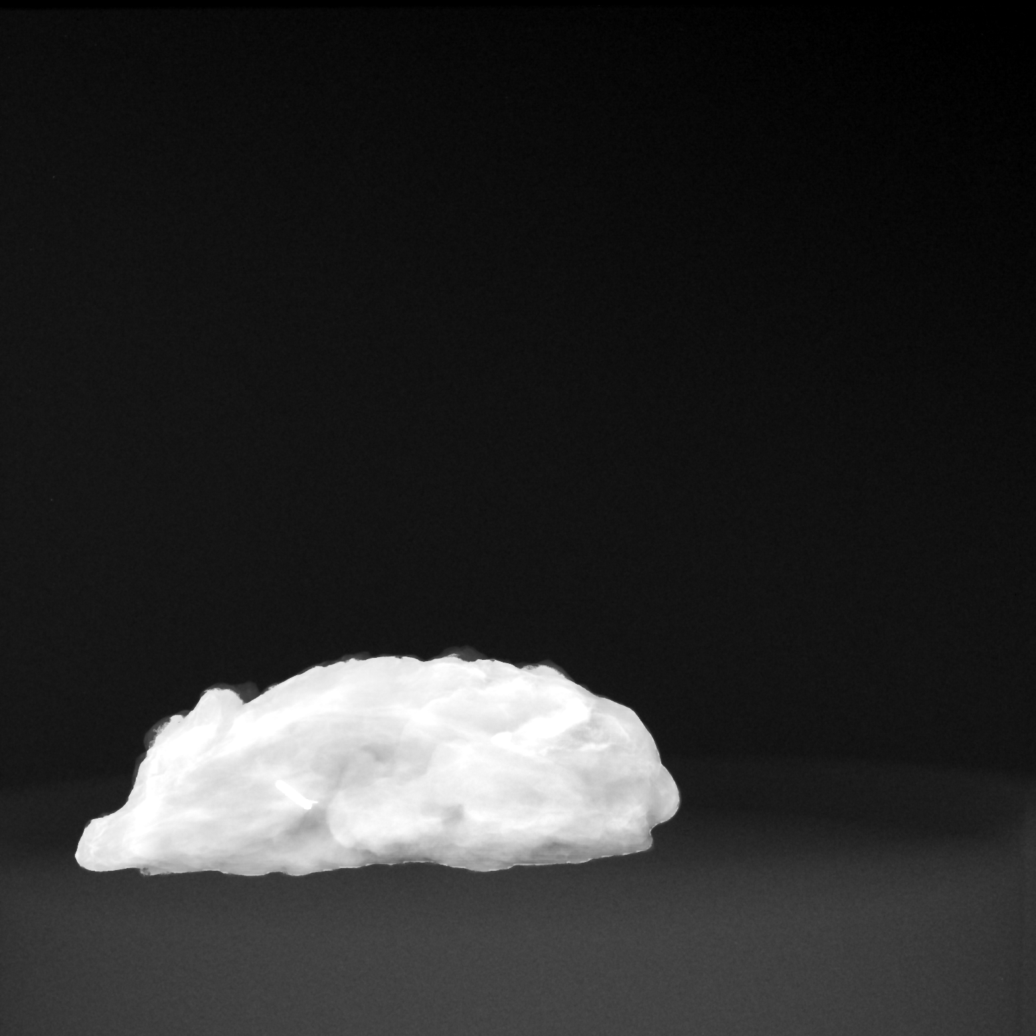
[im 2/2]
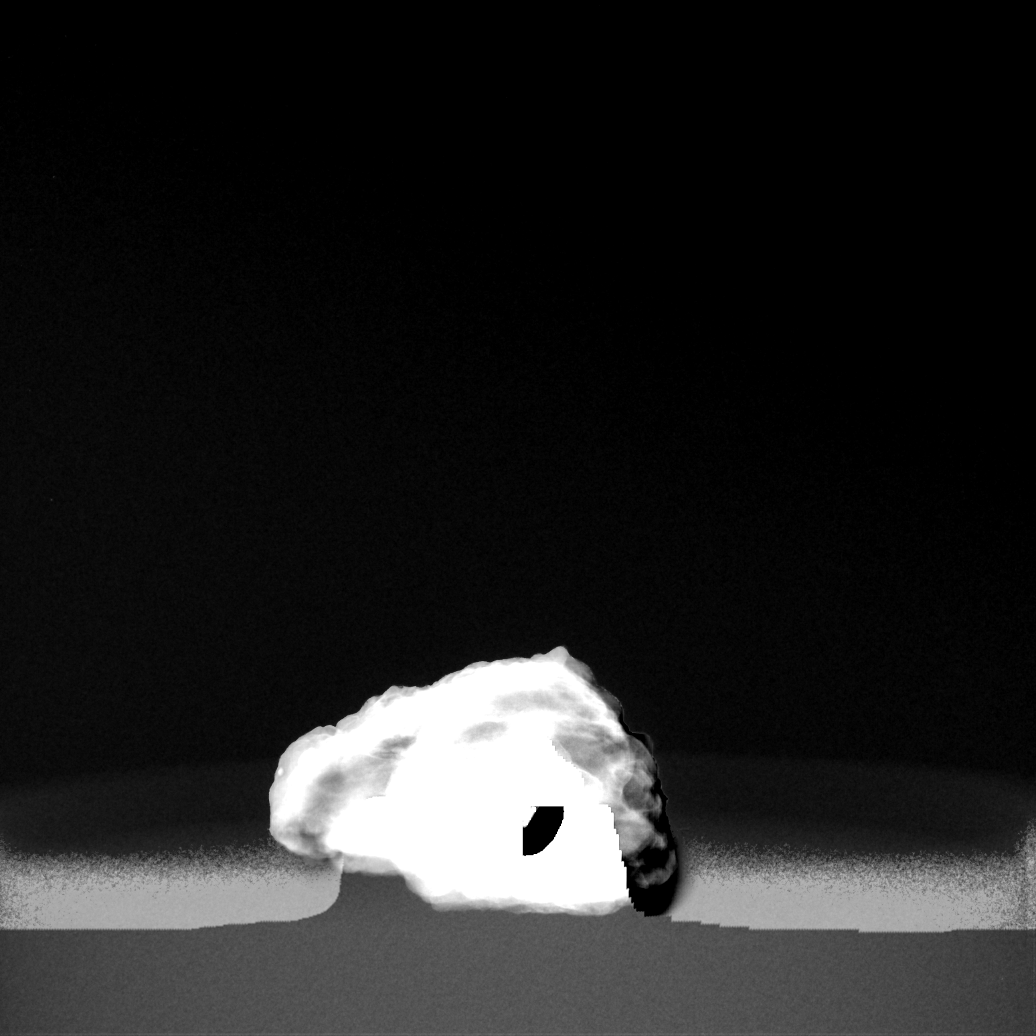

[4 of 4 positions shown; findings below may reference images not displayed]

FINDINGS: Status post excision of the right breast. The radioactive seed and
biopsy marker clip are present, completely intact, and were marked
for pathology.
IMPRESSION: Specimen radiograph of the right breast.

## 2023-10-21 ENCOUNTER — Other Ambulatory Visit: Payer: Self-pay | Admitting: Cardiology

## 2023-11-20 ENCOUNTER — Other Ambulatory Visit: Payer: Self-pay | Admitting: Cardiology

## 2023-12-21 ENCOUNTER — Other Ambulatory Visit: Payer: Self-pay | Admitting: Cardiology

## 2023-12-22 ENCOUNTER — Other Ambulatory Visit: Payer: Self-pay | Admitting: Cardiology

## 2024-01-24 ENCOUNTER — Other Ambulatory Visit: Payer: Self-pay | Admitting: Cardiology

## 2024-02-21 ENCOUNTER — Encounter: Payer: Self-pay | Admitting: *Deleted

## 2024-02-21 ENCOUNTER — Other Ambulatory Visit: Payer: Self-pay | Admitting: Obstetrics and Gynecology

## 2024-02-21 DIAGNOSIS — Z1231 Encounter for screening mammogram for malignant neoplasm of breast: Secondary | ICD-10-CM

## 2024-02-22 ENCOUNTER — Ambulatory Visit: Attending: Cardiology | Admitting: Cardiology

## 2024-02-22 ENCOUNTER — Encounter: Payer: Self-pay | Admitting: Cardiology

## 2024-02-22 VITALS — BP 130/90 | HR 76 | Ht 66.0 in | Wt 226.0 lb

## 2024-02-22 DIAGNOSIS — Z8249 Family history of ischemic heart disease and other diseases of the circulatory system: Secondary | ICD-10-CM

## 2024-02-22 DIAGNOSIS — I493 Ventricular premature depolarization: Secondary | ICD-10-CM | POA: Diagnosis not present

## 2024-02-22 DIAGNOSIS — R002 Palpitations: Secondary | ICD-10-CM | POA: Diagnosis not present

## 2024-02-22 DIAGNOSIS — N83202 Unspecified ovarian cyst, left side: Secondary | ICD-10-CM

## 2024-02-22 NOTE — Progress Notes (Signed)
 Cardiology Office Note:    Date:  02/22/2024   ID:  Arneta, [name] 16, 1962, MRN [id number]  PCP:  Conley Dene BROCKS., MD  Cardiologist:  Lamar Fitch, MD    Referring MD: Conley Dene BROCKS., MD   Chief Complaint  Patient presents with   Annual Exam  Doing fine  History of Present Illness:    [name] is a 63 y.o. female past medical history significant for palpitations, she does have PVCs, migraine headache, dyslipidemia.  Comes today to months of follow-up palpitation wise doing well very rare palpitations about her much.  Concern however is the fact that she required hysterectomy and she was told that she required both ovaries taken out so bilateral of her rectum and she got some cardiac concerns about it.  My understanding is that over ectomy is considered because she got to lumpectomy done already for breast cancer.  And in the situation of her rectum is recommended.  I told her that for potential negative echo from heart point of view are rather small and we can manage this I will suggest however to do calcium score to help us  determine the risks.  Past Medical History:  Diagnosis Date   GERD (gastroesophageal reflux disease)    Insomnia    Migraine headache    Overactive bladder    PVC's (premature ventricular contractions)    on metoprolol     Past Surgical History:  Procedure Laterality Date   BREAST EXCISIONAL BIOPSY Right 04/11/2019   LCIS   EXPLORATORY LAPAROTOMY LEFT CYSTECTOMY     DERMOID ON LEFT   LAPAROSCOPIC CHLOECYSTECTOMY  2005   LEEP [place]E     MOLE REMOVAL     PRECANCEROUS   PILONIDIAL CYST  1998   RADIOACTIVE SEED GUIDED EXCISIONAL BREAST BIOPSY Right 04/11/2019   Procedure: RADIOACTIVE SEED GUIDED EXCISIONAL RIGHT BREAST BIOPSY;  Surgeon: Ebbie Cough, MD;  Location: [facility];  Service: General;  Laterality: Right;   RADIOACTIVE SEED GUIDED EXCISIONAL BREAST BIOPSY Right 06/09/2021   Procedure: RADIOACTIVE  SEED GUIDED EXCISIONAL RIGHT BREAST BIOPSY;  Surgeon: Ebbie Cough, MD;  Location: [facility];  Service: General;  Laterality: Right;   SUBURETHRAL SLING  11/30/2009   SOLYX    Current Medications: Active Medications[1]   Allergies:   Patient has no known allergies.   Social History   Socioeconomic History   Marital status: Married    Spouse name: Not on file   Number of children: Not on file   Years of education: Not on file   Highest education level: Not on file  Occupational History   Not on file  Tobacco Use   Smoking status: Never   Smokeless tobacco: Never  Vaping Use   Vaping status: Never Used  Substance and Sexual Activity   Alcohol use: Not Currently    Comment: rarely   Drug use: Never   Sexual activity: Yes    Birth control/protection: None    Comment: states periods are very irreg and spotty  Other Topics Concern   Not on file  Social History Narrative   Not on file   Social Drivers of Health   Tobacco Use: Low Risk (02/22/2024)   Patient History    Smoking Tobacco Use: Never    Smokeless Tobacco Use: Never    Passive Exposure: Not on file  Financial Resource Strain: Not on file  Food Insecurity: No Food Insecurity (12/27/2023)   Received from [facility]  Epic    Within the past 12 months, you worried that your food would run out before you got the money to buy more.: Never true    Within the past 12 months, the food you bought just didn't last and you didn't have money to get more.: Never true  Transportation Needs: No Transportation Needs (12/27/2023)   Received [facility]    In the past 12 months, has lack of transportation kept you from medical appointments or from getting medications?: No    Lack of Transportation (Non-Medical): No  Physical Activity: Not on file  Stress: Not on file  Social Connections: Not on file  Depression (EYV7-0): Not on file   Alcohol Screen: Not on file  Housing: Unknown (12/24/2023)   Received from [facility]   Epic    Unable to Pay for Housing in the Last Year: Not on file    Number of Times Moved in the Last Year: Not on file    At any time in the past 12 months, were you homeless or living in a shelter (including now)?: No  Utilities: Not on file  Health Literacy: Not on file     Family History: The patient's family history includes Breast cancer in her maternal grandmother; Heart disease in her father. ROS:   Please see the history of present illness.    All 14 point review of systems negative except as described per history of present illness  EKGs/Labs/Other Studies Reviewed:         Recent Labs: No results found for requested labs within last 365 days.  Recent Lipid Panel    Component Value Date/Time   CHOL 171 09/09/2021 1011   TRIG 80 09/09/2021 1011   HDL 45 09/09/2021 1011   CHOLHDL 3.8 09/09/2021 1011   LDLCALC 111 (H) 09/09/2021 1011    Physical Exam:    VS:  BP (!) 130/90   Pulse 76   Ht 5' 6 (1.676 m)   Wt 226 lb (102.5 kg)   SpO2 99%   BMI 36.48 kg/m     Wt Readings from Last 3 Encounters:  02/22/24 226 lb (102.5 kg)  01/10/23 234 lb 3.2 oz (106.2 kg)  09/09/21 231 lb 3.2 oz (104.9 kg)     GEN:  Well nourished, well developed in no acute distress HEENT: Normal NECK: No JVD; No carotid bruits LYMPHATICS: No lymphadenopathy CARDIAC: RRR, no murmurs, no rubs, no gallops RESPIRATORY:  Clear to auscultation without rales, wheezing or rhonchi  ABDOMEN: Soft, non-tender, non-distended MUSCULOSKELETAL:  No edema; No deformity  SKIN: Warm and dry LOWER EXTREMITIES: no swelling NEUROLOGIC:  Alert and oriented x 3 PSYCHIATRIC:  Normal affect   ASSESSMENT:    1. Premature ventricular beats   2. Family history of early CAD   3. Palpitations   4. Left ovarian cyst    PLAN:    In order of problems listed above:  Palpitations does not bother  her much.  Will continue present management. Dyslipidemia I did review KPN which show me her LDL of 111 HDL 45.  Will do calcium score to help determine risk and need for management. Palpitations denies having any.  Need for hysterectomy.  Discussion as above   Medication Adjustments/Labs and Tests Ordered: Current medicines are reviewed at length with the patient today.  Concerns regarding medicines are outlined above.  Orders Placed This Encounter  Procedures   CT CARDIAC SCORING (SELF  PAY ONLY)   EKG 12-Lead   Medication changes: No orders of the defined types were placed in this encounter.   Signed, Lamar DOROTHA Fitch, MD, [facility] 02/22/2024 10:06 AM    [facility]     [1]  Current Meds  Medication Sig   aspirin EC 81 MG tablet Take 81 mg by mouth daily. Swallow whole.   Coenzyme Q10 (CO Q-10 PO) Take 300 mg by mouth daily.   KRILL OIL PO Take 500 mg by mouth daily.   metoprolol  succinate (TOPROL -XL) 25 MG 24 hr tablet Take 1 tablet (25 mg total) by mouth daily. WITH OR IMMEDIATELY FOLLOWING A MEAL   Multiple Vitamin (MULTIVITAMIN ADULT PO) Take 1 tablet by mouth daily.   OVER THE COUNTER MEDICATION Take 1 tablet by mouth daily. Veggies and Fruit Tab Natrol   zinc gluconate 50 MG tablet Take 50 mg by mouth daily.

## 2024-02-22 NOTE — Patient Instructions (Addendum)
 Medication Instructions:  Your physician recommends that you continue on your current medications as directed. Please refer to the Current Medication list given to you today.  *If you need a refill on your cardiac medications before your next appointment, please call your pharmacy*   Lab Work: None Ordered If you have labs (blood work) drawn today and your tests are completely normal, you will receive your results only by: MyChart Message (if you have MyChart) OR A paper copy in the mail If you have any lab test that is abnormal or we need to change your treatment, we will call you to review the results.   Testing/Procedures: We will order CT coronary calcium score. It will cost $99.00 and is not covered by insurance.  Please call to schedule.    [facility] [address] [place], [place] [address] [phone]    Follow-Up: At [facility], you and your health needs are our priority.  As part of our continuing mission to provide you with exceptional heart care, we have created designated Provider Care Teams.  These Care Teams include your primary Cardiologist (physician) and Advanced Practice Providers (APPs -  Physician Assistants and Nurse Practitioners) who all work together to provide you with the care you need, when you need it.  We recommend signing up for the patient portal called MyChart.  Sign up information is provided on this After Visit Summary.  MyChart is used to connect with patients for Virtual Visits (Telemedicine).  Patients are able to view lab/test results, encounter notes, upcoming appointments, etc.  Non-urgent messages can be sent to your provider as well.   To learn more about what you can do with MyChart, go to [email or web address].    Your next appointment:   12 month(s)  The format for your next appointment:   In Person  Provider:   Lamar Fitch, MD    Other Instructions NA

## 2024-02-29 ENCOUNTER — Ambulatory Visit (HOSPITAL_BASED_OUTPATIENT_CLINIC_OR_DEPARTMENT_OTHER)
Admission: RE | Admit: 2024-02-29 | Discharge: 2024-02-29 | Disposition: A | Discharge status: Home / Self Care | Payer: Self-pay | Source: Ambulatory Visit | Attending: Cardiology | Admitting: Cardiology

## 2024-02-29 DIAGNOSIS — Z8249 Family history of ischemic heart disease and other diseases of the circulatory system: Secondary | ICD-10-CM

## 2024-03-04 ENCOUNTER — Ambulatory Visit: Payer: Self-pay | Admitting: Cardiology

## 2024-03-11 ENCOUNTER — Telehealth: Payer: Self-pay

## 2024-03-11 NOTE — Telephone Encounter (Signed)
Ca Score Results reviewed with pt as per Dr. Vanetta Shawl note.  Pt verbalized understanding and had no additional questions. Routed to PCP

## 2024-03-25 ENCOUNTER — Ambulatory Visit
Admission: RE | Admit: 2024-03-25 | Discharge: 2024-03-25 | Disposition: A | Source: Ambulatory Visit | Attending: Obstetrics and Gynecology | Admitting: Obstetrics and Gynecology

## 2024-03-25 DIAGNOSIS — Z1231 Encounter for screening mammogram for malignant neoplasm of breast: Secondary | ICD-10-CM

## 2024-03-29 ENCOUNTER — Telehealth: Payer: Self-pay

## 2024-03-29 NOTE — Telephone Encounter (Signed)
 Left message on My Chart with Ca score results per Dr. Karry note. Routed to PCP.
